# Patient Record
Sex: Female | Born: 1975 | Race: White | Hispanic: No | Marital: Married | Smoking: Never smoker
Health system: Southern US, Community
[De-identification: ages and names within clinical notes are randomized; demographics above are authoritative.]

## PROBLEM LIST (undated history)

## (undated) DIAGNOSIS — G039 Meningitis, unspecified: Secondary | ICD-10-CM

## (undated) DIAGNOSIS — G009 Bacterial meningitis, unspecified: Secondary | ICD-10-CM

## (undated) DIAGNOSIS — I429 Cardiomyopathy, unspecified: Secondary | ICD-10-CM

## (undated) DIAGNOSIS — D649 Anemia, unspecified: Secondary | ICD-10-CM

## (undated) DIAGNOSIS — I4892 Unspecified atrial flutter: Secondary | ICD-10-CM

## (undated) HISTORY — DX: Unspecified atrial flutter: I48.92

## (undated) HISTORY — DX: Cardiomyopathy, unspecified: I42.9

## (undated) HISTORY — DX: Anemia, unspecified: D64.9

## (undated) HISTORY — PX: OTHER SURGICAL HISTORY: SHX169

## (undated) HISTORY — PX: TUBAL LIGATION: SHX77

---

## 2013-01-29 ENCOUNTER — Emergency Department (HOSPITAL_COMMUNITY): Payer: BC Managed Care – PPO

## 2013-01-29 ENCOUNTER — Encounter (HOSPITAL_COMMUNITY): Payer: Self-pay | Admitting: *Deleted

## 2013-01-29 ENCOUNTER — Emergency Department (HOSPITAL_COMMUNITY)
Admission: EM | Admit: 2013-01-29 | Discharge: 2013-01-29 | Disposition: A | Discharge status: Home / Self Care | Payer: BC Managed Care – PPO | Attending: Emergency Medicine | Admitting: Emergency Medicine

## 2013-01-29 DIAGNOSIS — M79609 Pain in unspecified limb: Secondary | ICD-10-CM | POA: Insufficient documentation

## 2013-01-29 DIAGNOSIS — R0602 Shortness of breath: Secondary | ICD-10-CM | POA: Insufficient documentation

## 2013-01-29 DIAGNOSIS — Z79899 Other long term (current) drug therapy: Secondary | ICD-10-CM | POA: Insufficient documentation

## 2013-01-29 DIAGNOSIS — J3489 Other specified disorders of nose and nasal sinuses: Secondary | ICD-10-CM | POA: Insufficient documentation

## 2013-01-29 DIAGNOSIS — Z8661 Personal history of infections of the central nervous system: Secondary | ICD-10-CM | POA: Insufficient documentation

## 2013-01-29 DIAGNOSIS — M79604 Pain in right leg: Secondary | ICD-10-CM

## 2013-01-29 HISTORY — DX: Meningitis, unspecified: G03.9

## 2013-01-29 MED ORDER — NAPROXEN 500 MG PO TABS: 500.0000 mg | ORAL_TABLET | Freq: Two times a day (BID) | ORAL | Status: DC

## 2013-01-29 MED ORDER — HYDROCODONE-ACETAMINOPHEN 5-325 MG PO TABS
1.0000 | ORAL_TABLET | Freq: Once | ORAL | Status: DC
  Filled 2013-01-29: qty 1

## 2013-01-29 MED ORDER — HYDROCODONE-ACETAMINOPHEN 5-325 MG PO TABS: 1.0000 | ORAL_TABLET | Freq: Four times a day (QID) | ORAL | Status: DC | PRN

## 2013-01-29 NOTE — ED Notes (Signed)
C/o right leg pain for past 2 weeks, states knot behind right knee, states right foot was cooler than left last night

## 2013-01-29 NOTE — ED Notes (Signed)
Pt back from [place]

## 2013-01-29 NOTE — ED Provider Notes (Signed)
History  This chart was scribed for Shelda Jakes, MD by Ardelia Mems, ED Scribe. This patient was seen in room [facility]2/[facility] and the patient's care was started at 4:03 PM.   CSN: [id number]  Arrival date & time 01/29/13  1347     Chief Complaint  Patient presents with  . Leg Pain    HPI  HPI Comments: Denise Hood is a 37 y.o. female who presents to the Emergency Department complaining of 2 weeks of constant, moderate bilateral leg pain. Pt states that she became more concerned about the right leg yesterday when she had a sudden onset of severe pain behind the right knee. Pt reports associated swelling in her right thigh and states that she has a knot on the back of her right leg behind her knee. Pt also states that her right foot was cooler than her left last night. Pt reports some associated SOB and denies chest pain. Pt states that she was diaphoretic when she woke up this morning. Pt states that she has a h/o of having a blood clot in 2009. Pt states that she got a shot of Lovenox in her abdomen and the blood clot was gone the next day per [place]. Pt denies fever, chills, vomiting, diarrhea, headaches or any other symptoms. Pt denies smoking and alcohol use.     Past Medical History  Diagnosis Date  . Meningitis     Past Surgical History  Procedure Laterality Date  . Abscess surgery      History reviewed. No pertinent family history.  History  Substance Use Topics  . Smoking status: Never Smoker   . Smokeless tobacco: Not on file  . Alcohol Use: No    OB History   Grav Para Term Preterm Abortions TAB SAB Ect Mult Living                  Review of Systems  Constitutional: Negative for fever and chills.  HENT: Positive for congestion. Negative for sore throat, rhinorrhea and neck pain.   Eyes: Negative for visual disturbance.  Respiratory: Positive for shortness of breath. Negative for cough.   Cardiovascular: Negative for chest pain and leg swelling.   Gastrointestinal: Negative for nausea, vomiting, abdominal pain and diarrhea.  Genitourinary: Negative for dysuria and hematuria.  Musculoskeletal: Negative for back pain.  Skin: Negative for pallor.  Neurological: Negative for headaches.  A complete 10 system review of systems was obtained and all systems are negative except as noted in the HPI and PMH.    Allergies  Review of patient's allergies indicates no known allergies.  Home Medications   Current Outpatient Rx  Name  Route  Sig  Dispense  Refill  . amphetamine-dextroamphetamine (ADDERALL) 20 MG tablet   Oral   Take 20 mg by mouth 3 (three) times daily.         . Aspirin-Acetaminophen (GOODYS BODY PAIN PO)   Oral   Take 2 Packages by mouth daily as needed (blood clot).         . B Complex Vitamins (VITAMIN B COMPLEX PO)   Oral   Take 1 tablet by mouth daily.         Marland Kitchen lamoTRIgine (LAMICTAL) 200 MG tablet   Oral   Take 200 mg by mouth daily.         Marland Kitchen MACA ROOT PO   Oral   Take 1 each by mouth daily.         Marland Kitchen OVER THE COUNTER  MEDICATION   Oral   Take 2 tablets by mouth at bedtime.         . Polysacch Fe Cmp-Fe Heme Poly (BIFERA PO)   Oral   Take 2 tablets by mouth daily.         Marland Kitchen HYDROcodone-acetaminophen (NORCO/VICODIN) 5-325 MG per tablet   Oral   Take 1-2 tablets by mouth every 6 (six) hours as needed for pain.   14 tablet   0   . naproxen (NAPROSYN) 500 MG tablet   Oral   Take 1 tablet (500 mg total) by mouth 2 (two) times daily.   14 tablet   0     Triage Vitals: BP 123/74  Pulse 77  Temp(Src) 98.1 F (36.7 C) (Oral)  Resp 16  SpO2 100%  LMP 01/01/2013  Physical Exam  Constitutional: She is oriented to person, place, and time. She appears well-developed and well-nourished.  HENT:  Head: Normocephalic and atraumatic.  Eyes: EOM are normal. Pupils are equal, round, and reactive to light.  Neck: Normal range of motion. Neck supple. No tracheal deviation present.   Cardiovascular: Normal rate, regular rhythm, normal heart sounds and intact distal pulses.   No murmur heard. Pulmonary/Chest: Effort normal and breath sounds normal. No respiratory distress.  Abdominal: Soft. Bowel sounds are normal. There is no tenderness.  Musculoskeletal:  Tender in popliteal part of knee. No swelling in anterior knee, knee cap is placed properly. DP pulse to right foot is 2+. PT pulse to right foot is 2+.   Neurological: She is alert and oriented to person, place, and time.  Skin: Skin is warm and dry. No rash noted.  Psychiatric: She has a normal mood and affect.    ED Course  Procedures (including critical care time)  DIAGNOSTIC STUDIES: Oxygen Saturation is 100% on RA, normal by my interpretation.    COORDINATION OF CARE: 4:15 PM- Pt advised of plan for treatment and pt agrees.    Labs Reviewed - No data to display US Venous Img Lower Unilateral Right  01/29/2013   *RADIOLOGY REPORT*  Clinical Data: Right knee pain  RIGHT LOWER EXTREMITY VENOUS DUPLEX ULTRASOUND  Technique:  Gray-scale sonography with graded compression, as well as color Doppler and duplex ultrasound were performed to evaluate the deep venous system of the lower extremity from the level of the common femoral vein through the popliteal and proximal calf veins. Spectral Doppler was utilized to evaluate flow at rest and with distal augmentation maneuvers.  Comparison:  None.  Findings:  Normal compressibility of the common femoral, superficial femoral, and popliteal veins is demonstrated, as well as the visualized proximal calf veins.  No filling defects to suggest DVT on grayscale or color Doppler imaging.  Doppler waveforms show normal direction of venous flow, normal respiratory phasicity and response to augmentation. Great saphenous vein is patent and compressible.  IMPRESSION:  No evidence of lower extremity deep vein thrombosis.   Original Report Authenticated By: Malachy Moan, M.D.      1. Leg pain, right       MDM  Workup for the right leg pain mostly behind the knee without evidence of any acute knee injury or deformity or effusion. Also Doppler studies negative for deep vein thrombosis or Baker's cyst. Most likely musculoskeletal discomfort we'll treat with anti-inflammatories and pain medicine have patient followup with her primary care Dr. Patient was worried about a keeping thrombosis and she had one in the past.  I personally performed the services described in this documentation, which was scribed in my presence. The recorded information has been reviewed and is accurate.     Shelda Jakes, MD 01/29/13 [phone]

## 2017-01-18 ENCOUNTER — Other Ambulatory Visit (HOSPITAL_COMMUNITY): Payer: Self-pay | Admitting: Internal Medicine

## 2017-01-18 DIAGNOSIS — Z1231 Encounter for screening mammogram for malignant neoplasm of breast: Secondary | ICD-10-CM

## 2017-01-28 ENCOUNTER — Ambulatory Visit (HOSPITAL_COMMUNITY): Payer: 59

## 2019-07-21 ENCOUNTER — Other Ambulatory Visit: Payer: Self-pay

## 2019-07-21 DIAGNOSIS — Z20822 Contact with and (suspected) exposure to covid-19: Secondary | ICD-10-CM

## 2019-07-23 LAB — NOVEL CORONAVIRUS, NAA: SARS-CoV-2, NAA: NOT DETECTED

## 2019-11-03 ENCOUNTER — Emergency Department (HOSPITAL_COMMUNITY): Payer: Self-pay

## 2019-11-03 ENCOUNTER — Other Ambulatory Visit (HOSPITAL_COMMUNITY): Payer: Self-pay | Admitting: Internal Medicine

## 2019-11-03 ENCOUNTER — Other Ambulatory Visit: Payer: Self-pay

## 2019-11-03 ENCOUNTER — Encounter (HOSPITAL_COMMUNITY): Payer: Self-pay | Admitting: *Deleted

## 2019-11-03 ENCOUNTER — Ambulatory Visit (HOSPITAL_COMMUNITY)
Admission: RE | Admit: 2019-11-03 | Discharge: 2019-11-03 | Disposition: A | Discharge status: Home / Self Care | Payer: Self-pay | Source: Ambulatory Visit | Attending: Internal Medicine | Admitting: Internal Medicine

## 2019-11-03 ENCOUNTER — Observation Stay (HOSPITAL_COMMUNITY)
Admission: EM | Admit: 2019-11-03 | Discharge: 2019-11-05 | Disposition: A | Discharge status: Home / Self Care | Payer: Self-pay | Attending: Family Medicine | Admitting: Family Medicine

## 2019-11-03 DIAGNOSIS — Z7982 Long term (current) use of aspirin: Secondary | ICD-10-CM | POA: Insufficient documentation

## 2019-11-03 DIAGNOSIS — I272 Pulmonary hypertension, unspecified: Secondary | ICD-10-CM | POA: Insufficient documentation

## 2019-11-03 DIAGNOSIS — R002 Palpitations: Secondary | ICD-10-CM | POA: Insufficient documentation

## 2019-11-03 DIAGNOSIS — Z7901 Long term (current) use of anticoagulants: Secondary | ICD-10-CM | POA: Insufficient documentation

## 2019-11-03 DIAGNOSIS — I081 Rheumatic disorders of both mitral and tricuspid valves: Secondary | ICD-10-CM | POA: Insufficient documentation

## 2019-11-03 DIAGNOSIS — R0989 Other specified symptoms and signs involving the circulatory and respiratory systems: Secondary | ICD-10-CM | POA: Insufficient documentation

## 2019-11-03 DIAGNOSIS — Z20822 Contact with and (suspected) exposure to covid-19: Secondary | ICD-10-CM | POA: Insufficient documentation

## 2019-11-03 DIAGNOSIS — R05 Cough: Secondary | ICD-10-CM | POA: Insufficient documentation

## 2019-11-03 DIAGNOSIS — D500 Iron deficiency anemia secondary to blood loss (chronic): Secondary | ICD-10-CM

## 2019-11-03 DIAGNOSIS — G4700 Insomnia, unspecified: Secondary | ICD-10-CM | POA: Insufficient documentation

## 2019-11-03 DIAGNOSIS — Z8661 Personal history of infections of the central nervous system: Secondary | ICD-10-CM | POA: Insufficient documentation

## 2019-11-03 DIAGNOSIS — R0602 Shortness of breath: Secondary | ICD-10-CM | POA: Insufficient documentation

## 2019-11-03 DIAGNOSIS — R7989 Other specified abnormal findings of blood chemistry: Secondary | ICD-10-CM | POA: Insufficient documentation

## 2019-11-03 DIAGNOSIS — I5021 Acute systolic (congestive) heart failure: Secondary | ICD-10-CM | POA: Insufficient documentation

## 2019-11-03 DIAGNOSIS — D649 Anemia, unspecified: Secondary | ICD-10-CM | POA: Diagnosis present

## 2019-11-03 DIAGNOSIS — Z79899 Other long term (current) drug therapy: Secondary | ICD-10-CM | POA: Insufficient documentation

## 2019-11-03 DIAGNOSIS — R0609 Other forms of dyspnea: Secondary | ICD-10-CM | POA: Insufficient documentation

## 2019-11-03 DIAGNOSIS — I425 Other restrictive cardiomyopathy: Secondary | ICD-10-CM | POA: Insufficient documentation

## 2019-11-03 DIAGNOSIS — J90 Pleural effusion, not elsewhere classified: Secondary | ICD-10-CM | POA: Insufficient documentation

## 2019-11-03 DIAGNOSIS — Z885 Allergy status to narcotic agent status: Secondary | ICD-10-CM | POA: Insufficient documentation

## 2019-11-03 DIAGNOSIS — I38 Endocarditis, valve unspecified: Secondary | ICD-10-CM | POA: Diagnosis present

## 2019-11-03 DIAGNOSIS — R609 Edema, unspecified: Secondary | ICD-10-CM | POA: Insufficient documentation

## 2019-11-03 DIAGNOSIS — R000 Tachycardia, unspecified: Secondary | ICD-10-CM | POA: Insufficient documentation

## 2019-11-03 DIAGNOSIS — R0600 Dyspnea, unspecified: Secondary | ICD-10-CM | POA: Insufficient documentation

## 2019-11-03 DIAGNOSIS — R0789 Other chest pain: Secondary | ICD-10-CM | POA: Insufficient documentation

## 2019-11-03 DIAGNOSIS — I519 Heart disease, unspecified: Secondary | ICD-10-CM | POA: Diagnosis present

## 2019-11-03 DIAGNOSIS — D509 Iron deficiency anemia, unspecified: Secondary | ICD-10-CM | POA: Insufficient documentation

## 2019-11-03 DIAGNOSIS — N920 Excessive and frequent menstruation with regular cycle: Secondary | ICD-10-CM | POA: Insufficient documentation

## 2019-11-03 DIAGNOSIS — I4589 Other specified conduction disorders: Secondary | ICD-10-CM | POA: Insufficient documentation

## 2019-11-03 DIAGNOSIS — F419 Anxiety disorder, unspecified: Secondary | ICD-10-CM | POA: Insufficient documentation

## 2019-11-03 DIAGNOSIS — I4892 Unspecified atrial flutter: Secondary | ICD-10-CM | POA: Diagnosis present

## 2019-11-03 DIAGNOSIS — R0601 Orthopnea: Secondary | ICD-10-CM | POA: Insufficient documentation

## 2019-11-03 DIAGNOSIS — I441 Atrioventricular block, second degree: Secondary | ICD-10-CM | POA: Insufficient documentation

## 2019-11-03 DIAGNOSIS — Z791 Long term (current) use of non-steroidal anti-inflammatories (NSAID): Secondary | ICD-10-CM | POA: Insufficient documentation

## 2019-11-03 DIAGNOSIS — I483 Typical atrial flutter: Secondary | ICD-10-CM | POA: Insufficient documentation

## 2019-11-03 HISTORY — DX: Unspecified atrial flutter: I48.92

## 2019-11-03 HISTORY — DX: Bacterial meningitis, unspecified: G00.9

## 2019-11-03 LAB — HEPATIC FUNCTION PANEL
ALT: 91 U/L — ABNORMAL HIGH (ref 0–44)
AST: 72 U/L — ABNORMAL HIGH (ref 15–41)
Albumin: 3.7 g/dL (ref 3.5–5.0)
Alkaline Phosphatase: 109 U/L (ref 38–126)
Bilirubin, Direct: 0.1 mg/dL (ref 0.0–0.2)
Indirect Bilirubin: 0.5 mg/dL (ref 0.3–0.9)
Total Bilirubin: 0.6 mg/dL (ref 0.3–1.2)
Total Protein: 6.5 g/dL (ref 6.5–8.1)

## 2019-11-03 LAB — BASIC METABOLIC PANEL
Anion gap: 10 (ref 5–15)
BUN: 17 mg/dL (ref 6–20)
CO2: 24 mmol/L (ref 22–32)
Calcium: 8.9 mg/dL (ref 8.9–10.3)
Chloride: 102 mmol/L (ref 98–111)
Creatinine, Ser: 0.78 mg/dL (ref 0.44–1.00)
GFR calc Af Amer: >60 mL/min (ref >60)
GFR calc non Af Amer: >60 mL/min (ref >60)
Glucose, Bld: 119 mg/dL — ABNORMAL HIGH (ref 70–99)
Potassium: 3.6 mmol/L (ref 3.5–5.1)
Sodium: 136 mmol/L (ref 135–145)

## 2019-11-03 LAB — RESPIRATORY PANEL BY RT PCR (FLU A&B, COVID)
Influenza A by PCR: NEGATIVE
Influenza B by PCR: NEGATIVE
SARS Coronavirus 2 by RT PCR: NEGATIVE

## 2019-11-03 LAB — CBC WITH DIFFERENTIAL/PLATELET
Abs Immature Granulocytes: 0.09 10*3/uL — ABNORMAL HIGH (ref 0.00–0.07)
Basophils Absolute: 0.1 10*3/uL (ref 0.0–0.1)
Basophils Relative: 1 %
Eosinophils Absolute: 0.4 10*3/uL (ref 0.0–0.5)
Eosinophils Relative: 3 %
HCT: 34 % — ABNORMAL LOW (ref 36.0–46.0)
Hemoglobin: 10.2 g/dL — ABNORMAL LOW (ref 12.0–15.0)
Immature Granulocytes: 1 %
Lymphocytes Relative: 25 %
Lymphs Abs: 3.2 10*3/uL (ref 0.7–4.0)
MCH: 23.2 pg — ABNORMAL LOW (ref 26.0–34.0)
MCHC: 30 g/dL (ref 30.0–36.0)
MCV: 77.4 fL — ABNORMAL LOW (ref 80.0–100.0)
Monocytes Absolute: 1.1 10*3/uL — ABNORMAL HIGH (ref 0.1–1.0)
Monocytes Relative: 8 %
Neutro Abs: 8.3 10*3/uL — ABNORMAL HIGH (ref 1.7–7.7)
Neutrophils Relative %: 62 %
Platelets: 703 10*3/uL — ABNORMAL HIGH (ref 150–400)
RBC: 4.39 MIL/uL (ref 3.87–5.11)
RDW: 15.6 % — ABNORMAL HIGH (ref 11.5–15.5)
WBC: 13.2 10*3/uL — ABNORMAL HIGH (ref 4.0–10.5)
nRBC: 0.3 % — ABNORMAL HIGH (ref 0.0–0.2)

## 2019-11-03 LAB — MAGNESIUM: Magnesium: 1.8 mg/dL (ref 1.7–2.4)

## 2019-11-03 LAB — TROPONIN I (HIGH SENSITIVITY)
Troponin I (High Sensitivity): 33 ng/L — ABNORMAL HIGH (ref <18)
Troponin I (High Sensitivity): 34 ng/L — ABNORMAL HIGH (ref <18)

## 2019-11-03 LAB — APTT: aPTT: 27 seconds (ref 24–36)

## 2019-11-03 LAB — TSH: TSH: 3.334 u[IU]/mL (ref 0.350–4.500)

## 2019-11-03 LAB — PROTIME-INR
INR: 1.2 (ref 0.8–1.2)
Prothrombin Time: 14.7 seconds (ref 11.4–15.2)

## 2019-11-03 LAB — D-DIMER, QUANTITATIVE (NOT AT ARMC): D-Dimer, Quant: 0.57 ug/mL-FEU — ABNORMAL HIGH (ref 0.00–0.50)

## 2019-11-03 MED ORDER — ACETAMINOPHEN 650 MG RE SUPP: 650.0000 mg | Freq: Four times a day (QID) | RECTAL | Status: DC | PRN

## 2019-11-03 MED ORDER — POLYETHYLENE GLYCOL 3350 17 G PO PACK: 17.0000 g | PACK | Freq: Every day | ORAL | Status: DC | PRN

## 2019-11-03 MED ORDER — DILTIAZEM LOAD VIA INFUSION
10.0000 mg | Freq: Once | INTRAVENOUS | Status: AC
  Administered 2019-11-03: 10 mg via INTRAVENOUS
  Filled 2019-11-03: qty 10

## 2019-11-03 MED ORDER — ONDANSETRON HCL 4 MG PO TABS: 4.0000 mg | ORAL_TABLET | Freq: Four times a day (QID) | ORAL | Status: DC | PRN

## 2019-11-03 MED ORDER — SODIUM CHLORIDE 0.9% FLUSH
3.0000 mL | Freq: Two times a day (BID) | INTRAVENOUS | Status: DC
  Administered 2019-11-03 – 2019-11-05 (×4): 3 mL via INTRAVENOUS

## 2019-11-03 MED ORDER — PANTOPRAZOLE SODIUM 40 MG PO TBEC
40.0000 mg | DELAYED_RELEASE_TABLET | Freq: Every day | ORAL | Status: DC
  Administered 2019-11-03 – 2019-11-04 (×2): 40 mg via ORAL
  Filled 2019-11-03 (×2): qty 1

## 2019-11-03 MED ORDER — DILTIAZEM HCL-DEXTROSE 125-5 MG/125ML-% IV SOLN (PREMIX)
5.0000 mg/h | INTRAVENOUS | Status: DC
  Administered 2019-11-03: 5 mg/h via INTRAVENOUS
  Filled 2019-11-03: qty 125

## 2019-11-03 MED ORDER — HYDROCODONE-ACETAMINOPHEN 5-325 MG PO TABS: 1.0000 | ORAL_TABLET | ORAL | Status: DC | PRN

## 2019-11-03 MED ORDER — AMIODARONE LOAD VIA INFUSION
150.0000 mg | Freq: Once | INTRAVENOUS | Status: DC
  Filled 2019-11-03: qty 83.34

## 2019-11-03 MED ORDER — POTASSIUM CHLORIDE CRYS ER 20 MEQ PO TBCR
40.0000 meq | EXTENDED_RELEASE_TABLET | Freq: Once | ORAL | Status: AC
  Administered 2019-11-03: 40 meq via ORAL
  Filled 2019-11-03: qty 2

## 2019-11-03 MED ORDER — ACETAMINOPHEN 325 MG PO TABS: 650.0000 mg | ORAL_TABLET | Freq: Four times a day (QID) | ORAL | Status: DC | PRN

## 2019-11-03 MED ORDER — LAMOTRIGINE 25 MG PO TABS: 200.0000 mg | ORAL_TABLET | Freq: Every day | ORAL | Status: DC

## 2019-11-03 MED ORDER — ONDANSETRON HCL 4 MG/2ML IJ SOLN: 4.0000 mg | Freq: Four times a day (QID) | INTRAMUSCULAR | Status: DC | PRN

## 2019-11-03 MED ORDER — IOHEXOL 350 MG/ML SOLN
100.0000 mL | Freq: Once | INTRAVENOUS | Status: AC | PRN
  Administered 2019-11-03: 100 mL via INTRAVENOUS

## 2019-11-03 MED ORDER — HEPARIN (PORCINE) 25000 UT/250ML-% IV SOLN
1150.0000 [IU]/h | INTRAVENOUS | Status: DC
  Administered 2019-11-03: 950 [IU]/h via INTRAVENOUS
  Filled 2019-11-03: qty 250

## 2019-11-03 MED ORDER — AMIODARONE HCL IN DEXTROSE 360-4.14 MG/200ML-% IV SOLN: 30.0000 mg/h | INTRAVENOUS | Status: DC

## 2019-11-03 MED ORDER — LORAZEPAM 0.5 MG PO TABS
0.5000 mg | ORAL_TABLET | Freq: Three times a day (TID) | ORAL | Status: DC | PRN
  Administered 2019-11-03: 0.5 mg via ORAL
  Filled 2019-11-03: qty 1

## 2019-11-03 MED ORDER — AMIODARONE HCL IN DEXTROSE 360-4.14 MG/200ML-% IV SOLN
60.0000 mg/h | INTRAVENOUS | Status: DC
  Filled 2019-11-03: qty 200

## 2019-11-03 MED ORDER — HEPARIN BOLUS VIA INFUSION
4000.0000 [IU] | Freq: Once | INTRAVENOUS | Status: AC
  Administered 2019-11-03: 4000 [IU] via INTRAVENOUS

## 2019-11-03 MED ORDER — MAGNESIUM SULFATE 2 GM/50ML IV SOLN
2.0000 g | Freq: Once | INTRAVENOUS | Status: AC
  Administered 2019-11-03: 20:00:00 2 g via INTRAVENOUS
  Filled 2019-11-03: qty 50

## 2019-11-03 NOTE — ED Notes (Signed)
Patient transported to CT scan . 

## 2019-11-03 NOTE — H&P (Addendum)
History and Physical    Denise Hood AJG:[id number] DOB: 20-May-1976 DOA: 11/03/2019  PCP: [name], MD   Patient coming from: Home   Chief Complaint: SOB, chest discomfort   HPI: Denise Hood is a 44 y.o. female with medical history significant for history of bacterial meningitis, menorrhagia, and iron deficiency anemia, now presenting to the emergency department with shortness of breath and chest discomfort.  Patient reports that she had been experiencing some dyspnea with occasional cough for at least a month now, and then developed some discomfort in her chest for the past several days.  She reports starting prednisone for the respiratory illness a couple days ago, but stopped taking it due to anxiety and insomnia.  She has not had any fevers, hemoptysis, or lower extremity swelling or tenderness.  She has not taken her Adderall in a few days due to insomnia.  ED Course: Upon arrival to the ED, patient is found to be afebrile, saturating well on room air, heart rate 160, and blood pressure 150/100.  EKG features atrial flutter with predominantly 2:1 AV block and rate 158.  Chest x-ray notable for cardiomegaly with pulmonary vascular congestion and early interstitial edema.  CTA chest is negative for PE but notable for small right pleural effusion.  High-sensitivity troponin was elevated to 33 and essentially unchanged more than 2 hours later.  Chemistry panel with mild elevation in transaminases and CBC notable for leukocytosis to 13,200, thrombocytosis to 700,000, and macrocytic anemia with hemoglobin 10.2.  Covid and influenza PCR is negative.  Cardiology was consulted by the ED physician, recommended starting anticoagulation and diltiazem, and consulting the cardiologist at [facility] in the morning.  Patient was given 2 mg IV magnesium, 10 mg IV diltiazem, started on diltiazem infusion, and started on IV heparin.  Review of Systems:  All other systems reviewed and apart from  HPI, are negative.  Past Medical History:  Diagnosis Date  . Bacterial meningitis   . Meningitis     Past Surgical History:  Procedure Laterality Date  . abscess surgery    . TUBAL LIGATION       reports that she has never smoked. She has never used smokeless tobacco. She reports that she does not drink alcohol or use drugs.  Allergies  Allergen Reactions  . Dilaudid [Hydromorphone]   . Fentanyl     History reviewed. No pertinent family history.   Prior to Admission medications   Medication Sig Start Date End Date Taking? Authorizing Provider  amphetamine-dextroamphetamine (ADDERALL) 20 MG tablet Take 20 mg by mouth 3 (three) times daily.   Yes [provider]  benzonatate (TESSALON) 100 MG capsule Take 100 mg by mouth 3 (three) times daily as needed for cough.   Yes [provider]  dextromethorphan-guaiFENesin (MUCINEX DM) 30-600 MG 12hr tablet Take 1 tablet by mouth 2 (two) times daily.   Yes [provider]  naproxen (NAPROSYN) 500 MG tablet Take 1 tablet (500 mg total) by mouth 2 (two) times daily. 01/29/13  Yes Vanetta Mulders, MD  Aspirin-Acetaminophen (GOODYS BODY PAIN PO) Take 2 Packages by mouth daily as needed (blood clot).    [provider]  B Complex Vitamins (VITAMIN B COMPLEX PO) Take 1 tablet by mouth daily.    [provider]  lamoTRIgine (LAMICTAL) 200 MG tablet Take 200 mg by mouth daily.    [provider]  MACA ROOT PO Take 1 each by mouth daily.    [provider]  Polysacch Fe Cmp-Fe Heme Poly (BIFERA PO) Take 2 tablets by mouth daily.    [provider]    Physical Exam: Vitals:   11/03/19 2020 11/03/19 2030 11/03/19 2110 11/03/19 2120  BP: (!) 142/107 (!) 149/104 (!) 145/116   Pulse: (!) 162 (!) 162 (!) 160 (!) 161  Resp: (!) 29 (!) 26 20 (!) 29  Temp:      TempSrc:      SpO2: 98% 98% 95% 99%  Weight:      Height:         Constitutional: NAD, calm  Eyes: PERTLA, lids  and conjunctivae normal ENMT: Mucous membranes are moist. Posterior pharynx clear of any exudate or lesions.   Neck: normal, supple, no masses, no thyromegaly Respiratory: clear to auscultation bilaterally, no wheezing, no crackles. No accessory muscle use.  Cardiovascular: Rate ~145 and regular. No extremity edema.  Abdomen: No distension, no tenderness, soft. Bowel sounds active.  Musculoskeletal: no clubbing / cyanosis. No joint deformity upper and lower extremities.   Skin: no significant rashes, lesions, ulcers. Warm, dry, well-perfused. Neurologic: No facial asymmetry. Sensation intact. Moving all extremities.  Psychiatric: Alert and oriented, appropriate throughout interview and exam. Very pleasant and cooperative.    Labs and Imaging on Admission: I have personally reviewed following labs and imaging studies  CBC: Recent Labs  Lab 11/03/19 1742  WBC 13.2*  NEUTROABS 8.3*  HGB 10.2*  HCT 34.0*  MCV 77.4*  PLT 703*   Basic Metabolic Panel: Recent Labs  Lab 11/03/19 1742  NA 136  K 3.6  CL 102  CO2 24  GLUCOSE 119*  BUN 17  CREATININE 0.78  CALCIUM 8.9  MG 1.8   GFR: Estimated Creatinine Clearance: 86.4 mL/min (by C-G formula based on SCr of 0.78 mg/dL). Liver Function Tests: Recent Labs  Lab 11/03/19 1742  AST 72*  ALT 91*  ALKPHOS 109  BILITOT 0.6  PROT 6.5  ALBUMIN 3.7   No results for input(s): LIPASE, AMYLASE in the last 168 hours. No results for input(s): AMMONIA in the last 168 hours. Coagulation Profile: Recent Labs  Lab 11/03/19 1742  INR 1.2   Cardiac Enzymes: No results for input(s): CKTOTAL, CKMB, CKMBINDEX, TROPONINI in the last 168 hours. BNP (last 3 results) No results for input(s): PROBNP in the last 8760 hours. HbA1C: No results for input(s): HGBA1C in the last 72 hours. CBG: No results for input(s): GLUCAP in the last 168 hours. Lipid Profile: No results for input(s): CHOL, HDL, LDLCALC, TRIG, CHOLHDL, LDLDIRECT in the last  72 hours. Thyroid Function Tests: No results for input(s): TSH, T4TOTAL, FREET4, T3FREE, THYROIDAB in the last 72 hours. Anemia Panel: No results for input(s): VITAMINB12, FOLATE, FERRITIN, TIBC, IRON, RETICCTPCT in the last 72 hours. Urine analysis: No results found for: COLORURINE, APPEARANCEUR, LABSPEC, PHURINE, GLUCOSEU, HGBUR, BILIRUBINUR, KETONESUR, PROTEINUR, UROBILINOGEN, NITRITE, LEUKOCYTESUR Sepsis Labs: @LABRCNTIP (procalcitonin:4,lacticidven:4) ) Recent Results (from the past 240 hour(s))  Respiratory Panel by RT PCR (Flu A&B, Covid) - Nasopharyngeal Swab     Status: None   Collection Time: 11/03/19  8:51 PM   Specimen: Nasopharyngeal Swab  Result Value Ref Range Status   SARS Coronavirus 2 by RT PCR NEGATIVE NEGATIVE Final    Comment: (NOTE) SARS-CoV-2 target nucleic acids are NOT DETECTED. The SARS-CoV-2 RNA is generally detectable in upper respiratoy specimens during the acute phase of infection. The lowest concentration of SARS-CoV-2 viral copies this assay can detect is 131 copies/mL. A negative result does not preclude SARS-Cov-2 infection  and should not be used as the sole basis for treatment or other patient management decisions. A negative result may occur with  improper specimen collection/handling, submission of specimen other than nasopharyngeal swab, presence of viral mutation(s) within the areas targeted by this assay, and inadequate number of viral copies (<131 copies/mL). A negative result must be combined with clinical observations, patient history, and epidemiological information. The expected result is Negative. Fact Sheet for Patients:  PinkCheek.be Fact Sheet for Healthcare Providers:  GravelBags.it This test is not yet ap proved or cleared by the [place] FDA and  has been authorized for detection and/or diagnosis of SARS-CoV-2 by FDA under an Emergency Use Authorization (EUA). This EUA  will remain  in effect (meaning this test can be used) for the duration of the COVID-19 declaration under Section 564(b)(1) of the Act, 21 U.S.C. section 360bbb-3(b)(1), unless the authorization is terminated or revoked sooner.    Influenza A by PCR NEGATIVE NEGATIVE Final   Influenza B by PCR NEGATIVE NEGATIVE Final    Comment: (NOTE) The Xpert Xpress SARS-CoV-2/FLU/RSV assay is intended as an aid in  the diagnosis of influenza from Nasopharyngeal swab specimens and  should not be used as a sole basis for treatment. Nasal washings and  aspirates are unacceptable for Xpert Xpress SARS-CoV-2/FLU/RSV  testing. Fact Sheet for Patients: PinkCheek.be Fact Sheet for Healthcare Providers: GravelBags.it This test is not yet approved or cleared by the [place] FDA and  has been authorized for detection and/or diagnosis of SARS-CoV-2 by  FDA under an Emergency Use Authorization (EUA). This EUA will remain  in effect (meaning this test can be used) for the duration of the  Covid-19 declaration under Section 564(b)(1) of the Act, 21  U.S.C. section 360bbb-3(b)(1), unless the authorization is  terminated or revoked. Performed at [facility], [address], [place], [place] [address]      Radiological Exams on Admission: DG Chest 2 View  Result Date: 11/03/2019 CLINICAL DATA:  Shortness of breath EXAM: CHEST - 2 VIEW COMPARISON:  None. FINDINGS: The heart size is enlarged. There is some vascular congestion with early interstitial edema. There is no large focal infiltrate. No pneumothorax. There is no significant pleural effusion. IMPRESSION: Cardiomegaly with pulmonary vascular congestion and early interstitial edema. Electronically Signed   By: Constance Holster M.D.   On: 11/03/2019 18:11   CT Angio Chest PE W and/or Wo Contrast  Result Date: 11/03/2019 CLINICAL DATA:  Shortness of breath and tachycardia. EXAM: CT ANGIOGRAPHY  CHEST WITH CONTRAST TECHNIQUE: Multidetector CT imaging of the chest was performed using the standard protocol during bolus administration of intravenous contrast. Multiplanar CT image reconstructions and MIPs were obtained to evaluate the vascular anatomy. CONTRAST:  133mL OMNIPAQUE IOHEXOL 350 MG/ML SOLN COMPARISON:  None. FINDINGS: Cardiovascular: Satisfactory opacification of the pulmonary arteries to the segmental level. No evidence of pulmonary embolism. Normal heart size. No pericardial effusion. Mediastinum/Nodes: No enlarged mediastinal, hilar, or axillary lymph nodes. Thyroid gland, trachea, and esophagus demonstrate no significant findings. Lungs/Pleura: There is no evidence of acute infiltrate. A small right pleural effusion is seen. No pneumothorax is identified. Upper Abdomen: No acute abnormality. Musculoskeletal: No chest wall abnormality. No acute or significant osseous findings. Review of the MIP images confirms the above findings. IMPRESSION: 1. No CT evidence of acute pulmonary embolism. 2. Small right pleural effusion. Electronically Signed   By: Virgina Norfolk M.D.   On: 11/03/2019 20:17    EKG: Independently reviewed. Atrial flutter with 2:1 AV  block, rate 158.   Assessment/Plan   1. Atrial flutter with rapid ventricular response  - Presents with >1 month of SOB and several days of chest discomfort and is found to be in atrial flutter with 2:1 AV block and rate 160  - CTA chest negative for PE or infiltrate  - She was started on IV heparin and IV diltiazem in ED with no change in HR  - Discussed with cardiology again, assistance much appreciated, recommended stopping diltiazem since no change in HR and trying amiodarone but noted that she may remain in RVR until cardioverted  - Hold Adderrall, check TSH, continue IV heparin, start amiodarone, keep NPO after midnight    ADDENDUM: She converted to SR with rate in 90's prior to amiodarone. Will hold the amiodarone, continue IV  heparin and cardiac monitoring for now.    2. Iron-deficiency anemia  - Hgb is 10.2 on admission with MCV 77 and platelets 703k  - Patient reports long hx of IDA attributed to menorrhagia and managed with iron supplementation  - No active bleeding, monitor closely on IV heparin    DVT prophylaxis: IV heparin  Code Status: Full  Family Communication: Discussed with patient  Disposition Plan: Likely home in 24-48 hours after cardiology consultation and treatment of rapid atrial flutter  Consults called: Discussed with cardiology Admission status: Observation     Briscoe Deutscher, MD [facility] Pager: See [email or web address]  If 7AM-7PM, please contact the daytime attending [email or web address]  11/03/2019, 9:37 PM

## 2019-11-03 NOTE — ED Triage Notes (Signed)
C/o shortness of breath, states she was advised to come in for CT of CHEST

## 2019-11-03 NOTE — ED Provider Notes (Signed)
[facility] EMERGENCY DEPARTMENT Provider Note   CSN: [id number] Arrival date & time: 11/03/19  1703     History Chief Complaint  Patient presents with  . Shortness of Breath    Denise Hood is a 44 y.o. female.  Presented to emergency department with chief complaint shortness of breath.  Patient reports that she has been having intermittent shortness of breath over the past couple months, steadily worsening.  Over the past week, at least for the past 5 days has been also having some palpitations, feels like her heart is going fast.  Denies any chest pain at this time.  Only having shortness of breath with exertion.  Denies any past cardiac issues, denies prior history A. fib, a flutter.  Not on anticoagulation.  Only reported medical problem was remote history of bacterial meningitis, meningococcemia.  HPI     Past Medical History:  Diagnosis Date  . Bacterial meningitis   . Meningitis     There are no problems to display for this patient.   Past Surgical History:  Procedure Laterality Date  . abscess surgery    . TUBAL LIGATION       OB History   No obstetric history on file.     History reviewed. No pertinent family history.  Social History   Tobacco Use  . Smoking status: Never Smoker  . Smokeless tobacco: Never Used  Substance Use Topics  . Alcohol use: No  . Drug use: No    Home Medications Prior to Admission medications   Medication Sig Start Date End Date Taking? Authorizing Provider  amphetamine-dextroamphetamine (ADDERALL) 20 MG tablet Take 20 mg by mouth 3 (three) times daily.    [provider]  Aspirin-Acetaminophen (GOODYS BODY PAIN PO) Take 2 Packages by mouth daily as needed (blood clot).    [provider]  B Complex Vitamins (VITAMIN B COMPLEX PO) Take 1 tablet by mouth daily.    [provider]  HYDROcodone-acetaminophen (NORCO/VICODIN) 5-325 MG per tablet Take 1-2 tablets by mouth every 6 (six) hours as needed  for pain. 01/29/13   Vanetta Mulders, MD  lamoTRIgine (LAMICTAL) 200 MG tablet Take 200 mg by mouth daily.    [provider]  MACA ROOT PO Take 1 each by mouth daily.    [provider]  naproxen (NAPROSYN) 500 MG tablet Take 1 tablet (500 mg total) by mouth 2 (two) times daily. 01/29/13   Vanetta Mulders, MD  OVER THE COUNTER MEDICATION Take 2 tablets by mouth at bedtime.    [provider]  Polysacch Fe Cmp-Fe Heme Poly (BIFERA PO) Take 2 tablets by mouth daily.    [provider]    Allergies    Dilaudid [hydromorphone] and Fentanyl  Review of Systems   Review of Systems  Constitutional: Negative for chills and fever.  HENT: Negative for ear pain and sore throat.   Eyes: Negative for pain and visual disturbance.  Respiratory: Positive for shortness of breath. Negative for cough.   Cardiovascular: Positive for palpitations. Negative for chest pain.  Gastrointestinal: Negative for abdominal pain and vomiting.  Genitourinary: Negative for dysuria and hematuria.  Musculoskeletal: Negative for arthralgias and back pain.  Skin: Negative for color change and rash.  Neurological: Negative for seizures and syncope.  All other systems reviewed and are negative.   Physical Exam Updated Vital Signs BP (!) 152/93 (BP Location: Left Arm)   Pulse (!) 160 Comment: told rn high pulse  Temp (!) 97.3 F (36.3  C) (Oral)   Resp 20   Ht 5' (1.524 m)   Wt 85.7 kg   LMP 10/20/2019   SpO2 100%   BMI 36.91 kg/m   Physical Exam Vitals and nursing note reviewed.  Constitutional:      General: She is not in acute distress.    Appearance: She is well-developed.  HENT:     Head: Normocephalic and atraumatic.  Eyes:     Conjunctiva/sclera: Conjunctivae normal.  Cardiovascular:     Rate and Rhythm: Regular rhythm. Tachycardia present.     Heart sounds: No murmur.  Pulmonary:     Effort: Pulmonary effort is normal. No respiratory distress.     Breath  sounds: Normal breath sounds.  Abdominal:     Palpations: Abdomen is soft.     Tenderness: There is no abdominal tenderness.  Musculoskeletal:     Cervical back: Neck supple.  Skin:    General: Skin is warm and dry.  Neurological:     Mental Status: She is alert.     ED Results / Procedures / Treatments   Labs (all labs ordered are listed, but only abnormal results are displayed) Labs Reviewed - No data to display  EKG None  November 02 1732 atrial flutter, 2-1 AV block, rate 158, no STEMI, no prior for comparison   Radiology No results found.  Procedures .Critical Care Performed by: Milagros Loll, MD Authorized by: Milagros Loll, MD   Critical care provider statement:    Critical care time (minutes):  45   Critical care was necessary to treat or prevent imminent or life-threatening deterioration of the following conditions:  Cardiac failure   Critical care was time spent personally by me on the following activities:  Discussions with consultants, evaluation of patient's response to treatment, examination of patient, ordering and performing treatments and interventions, ordering and review of laboratory studies, ordering and review of radiographic studies, pulse oximetry, re-evaluation of patient's condition, obtaining history from patient or surrogate and review of old charts   (including critical care time)  Medications Ordered in ED Medications - No data to display  ED Course  I have reviewed the triage vital signs and the nursing notes.  Pertinent labs & imaging results that were available during my care of the patient were reviewed by me and considered in my medical decision making (see chart for details).  Clinical Course as of Nov 02 2332  Tue Nov 03, 2019  2050 D/w Shirlee Latch   [RD]    Clinical Course User Index [RD] Milagros Loll, MD   MDM Rules/Calculators/A&P                      44 year old lady presenting to ER with complaints of  palpitations, shortness of breath.  On exam patient was well-appearing, noted to have tachycardia in 150s, stable BP.  EKG demonstrating a flutter with 2-1 block.  Medical work-up noted for elevated dimer, labs otherwise grossly within normal limits, slightly low magnesium.  CTA chest negative for pulmonary embolism.  Borderline elevation of troponin, no chest pain, suspect rate related.  Doubt ACS.  Based on history, suspect she has been in a flutter for at least the last few days if not longer.  Given this, not ER candidate for electrical cardioversion.  Discussed with cardiology, started on diltiazem, anticoagulation with heparin.  Admit to hospitalist service.  Anticipate cardiology to see tomorrow to discuss cardioversion.   Dr. Antionette Char accepting.  Final Clinical  Impression(s) / ED Diagnoses Final diagnoses:  Typical atrial flutter [facility])    Rx / DC Orders ED Discharge Orders    None       Lucrezia Starch, MD 11/03/19 2340

## 2019-11-03 NOTE — Progress Notes (Signed)
ANTICOAGULATION CONSULT NOTE - Initial Consult  Pharmacy Consult for heparin Indication: atrial flutter  Allergies  Allergen Reactions  . Dilaudid [Hydromorphone]   . Fentanyl     Patient Measurements: Height: 5' (152.4 cm) Weight: 189 lb (85.7 kg) IBW/kg (Calculated) : 45.5 Heparin Dosing Weight: 65.5 kg  Vital Signs: Temp: 97.3 F (36.3 C) (03/16 1713) Temp Source: Oral (03/16 1713) BP: 152/93 (03/16 1713) Pulse Rate: 162 (03/16 1920)  Labs: Recent Labs    11/03/19 1742  HGB 10.2*  HCT 34.0*  PLT 703*  APTT 27  LABPROT 14.7  INR 1.2  CREATININE 0.78  TROPONINIHS 33*    Estimated Creatinine Clearance: 86.4 mL/min (by C-G formula based on SCr of 0.78 mg/dL).   Medical History: Past Medical History:  Diagnosis Date  . Bacterial meningitis   . Meningitis     Medications:  (Not in a hospital admission)   Assessment: 47 yof who [facility] with SOB. Pharmacy consulted to begin IV heparin for A flutter. No AC PTA noted. Hgb low at 10.2, platelets are elevated at 703.  Goal of Therapy:  Heparin level 0.3-0.7 units/ml Monitor platelets by anticoagulation protocol: Yes   Plan:  Heparin 4000 units IV bolus then 950 units/hr 8h HL Daily HL, CBC Monitor for s/sx of bleeding  Thank you for involving pharmacy in this patient's care.  Loura Back, PharmD, BCPS Clinical Pharmacist Clinical phone for 11/03/2019 until 9:30p is [phone] 11/03/2019 7:31 PM  **Pharmacist phone directory can be found on [email or web address] listed under [facility]**

## 2019-11-04 ENCOUNTER — Observation Stay (HOSPITAL_BASED_OUTPATIENT_CLINIC_OR_DEPARTMENT_OTHER): Payer: Self-pay

## 2019-11-04 ENCOUNTER — Encounter (HOSPITAL_COMMUNITY): Payer: Self-pay | Admitting: Family Medicine

## 2019-11-04 DIAGNOSIS — I5021 Acute systolic (congestive) heart failure: Secondary | ICD-10-CM | POA: Diagnosis present

## 2019-11-04 DIAGNOSIS — D649 Anemia, unspecified: Secondary | ICD-10-CM | POA: Diagnosis present

## 2019-11-04 DIAGNOSIS — I4892 Unspecified atrial flutter: Secondary | ICD-10-CM

## 2019-11-04 DIAGNOSIS — I519 Heart disease, unspecified: Secondary | ICD-10-CM | POA: Diagnosis present

## 2019-11-04 DIAGNOSIS — I361 Nonrheumatic tricuspid (valve) insufficiency: Secondary | ICD-10-CM

## 2019-11-04 DIAGNOSIS — D509 Iron deficiency anemia, unspecified: Secondary | ICD-10-CM

## 2019-11-04 DIAGNOSIS — I38 Endocarditis, valve unspecified: Secondary | ICD-10-CM | POA: Diagnosis present

## 2019-11-04 DIAGNOSIS — I340 Nonrheumatic mitral (valve) insufficiency: Secondary | ICD-10-CM

## 2019-11-04 DIAGNOSIS — N920 Excessive and frequent menstruation with regular cycle: Secondary | ICD-10-CM | POA: Diagnosis present

## 2019-11-04 DIAGNOSIS — I429 Cardiomyopathy, unspecified: Secondary | ICD-10-CM

## 2019-11-04 DIAGNOSIS — J90 Pleural effusion, not elsewhere classified: Secondary | ICD-10-CM | POA: Diagnosis present

## 2019-11-04 HISTORY — DX: Cardiomyopathy, unspecified: I42.9

## 2019-11-04 LAB — RETICULOCYTES
Immature Retic Fract: 25.3 % — ABNORMAL HIGH (ref 2.3–15.9)
RBC.: 3.89 MIL/uL (ref 3.87–5.11)
Retic Count, Absolute: 78.2 10*3/uL (ref 19.0–186.0)
Retic Ct Pct: 2 % (ref 0.4–3.1)

## 2019-11-04 LAB — COMPREHENSIVE METABOLIC PANEL
ALT: 79 U/L — ABNORMAL HIGH (ref 0–44)
AST: 60 U/L — ABNORMAL HIGH (ref 15–41)
Albumin: 3.3 g/dL — ABNORMAL LOW (ref 3.5–5.0)
Alkaline Phosphatase: 105 U/L (ref 38–126)
Anion gap: 10 (ref 5–15)
BUN: 17 mg/dL (ref 6–20)
CO2: 23 mmol/L (ref 22–32)
Calcium: 8.5 mg/dL — ABNORMAL LOW (ref 8.9–10.3)
Chloride: 103 mmol/L (ref 98–111)
Creatinine, Ser: 0.88 mg/dL (ref 0.44–1.00)
GFR calc Af Amer: >60 mL/min (ref >60)
GFR calc non Af Amer: >60 mL/min (ref >60)
Glucose, Bld: 124 mg/dL — ABNORMAL HIGH (ref 70–99)
Potassium: 3.7 mmol/L (ref 3.5–5.1)
Sodium: 136 mmol/L (ref 135–145)
Total Bilirubin: 0.6 mg/dL (ref 0.3–1.2)
Total Protein: 5.9 g/dL — ABNORMAL LOW (ref 6.5–8.1)

## 2019-11-04 LAB — CBC
HCT: 30.1 % — ABNORMAL LOW (ref 36.0–46.0)
Hemoglobin: 8.7 g/dL — ABNORMAL LOW (ref 12.0–15.0)
MCH: 22.4 pg — ABNORMAL LOW (ref 26.0–34.0)
MCHC: 28.9 g/dL — ABNORMAL LOW (ref 30.0–36.0)
MCV: 77.6 fL — ABNORMAL LOW (ref 80.0–100.0)
Platelets: 603 10*3/uL — ABNORMAL HIGH (ref 150–400)
RBC: 3.88 MIL/uL (ref 3.87–5.11)
RDW: 15.9 % — ABNORMAL HIGH (ref 11.5–15.5)
WBC: 12.2 10*3/uL — ABNORMAL HIGH (ref 4.0–10.5)
nRBC: 0.6 % — ABNORMAL HIGH (ref 0.0–0.2)

## 2019-11-04 LAB — HEPATITIS PANEL, ACUTE
HCV Ab: NONREACTIVE
Hep A IgM: NONREACTIVE
Hep B C IgM: NONREACTIVE
Hepatitis B Surface Ag: NONREACTIVE

## 2019-11-04 LAB — IRON AND TIBC
Iron: 19 ug/dL — ABNORMAL LOW (ref 28–170)
Saturation Ratios: 4 % — ABNORMAL LOW (ref 10.4–31.8)
TIBC: 432 ug/dL (ref 250–450)
UIBC: 413 ug/dL

## 2019-11-04 LAB — ECHOCARDIOGRAM COMPLETE
Height: 60 in
Weight: 3024 oz

## 2019-11-04 LAB — HEPARIN LEVEL (UNFRACTIONATED): Heparin Unfractionated: 0.19 IU/mL — ABNORMAL LOW (ref 0.30–0.70)

## 2019-11-04 LAB — HIV ANTIBODY (ROUTINE TESTING W REFLEX): HIV Screen 4th Generation wRfx: NONREACTIVE

## 2019-11-04 LAB — RAPID URINE DRUG SCREEN, HOSP PERFORMED
Amphetamines: NOT DETECTED
Barbiturates: NOT DETECTED
Benzodiazepines: POSITIVE — AB
Cocaine: NOT DETECTED
Opiates: NOT DETECTED
Tetrahydrocannabinol: NOT DETECTED

## 2019-11-04 LAB — FERRITIN: Ferritin: 8 ng/mL — ABNORMAL LOW (ref 11–307)

## 2019-11-04 LAB — URINALYSIS, ROUTINE W REFLEX MICROSCOPIC
Bilirubin Urine: NEGATIVE
Glucose, UA: NEGATIVE mg/dL
Hgb urine dipstick: NEGATIVE
Ketones, ur: NEGATIVE mg/dL
Leukocytes,Ua: NEGATIVE
Nitrite: NEGATIVE
Protein, ur: NEGATIVE mg/dL
Specific Gravity, Urine: 1.014 (ref 1.005–1.030)
pH: 6 (ref 5.0–8.0)

## 2019-11-04 LAB — FOLATE: Folate: 6.1 ng/mL (ref >5.9)

## 2019-11-04 LAB — VITAMIN B12: Vitamin B-12: 569 pg/mL (ref 180–914)

## 2019-11-04 LAB — MRSA PCR SCREENING: MRSA by PCR: NEGATIVE

## 2019-11-04 MED ORDER — ZOLPIDEM TARTRATE 5 MG PO TABS: 5.0000 mg | ORAL_TABLET | Freq: Every evening | ORAL | Status: DC | PRN

## 2019-11-04 MED ORDER — FUROSEMIDE 40 MG PO TABS
40.0000 mg | ORAL_TABLET | Freq: Every day | ORAL | Status: DC
  Administered 2019-11-05: 40 mg via ORAL
  Filled 2019-11-04: qty 1

## 2019-11-04 MED ORDER — FUROSEMIDE 10 MG/ML IJ SOLN
40.0000 mg | Freq: Once | INTRAMUSCULAR | Status: AC
  Administered 2019-11-04: 40 mg via INTRAVENOUS
  Filled 2019-11-04: qty 4

## 2019-11-04 MED ORDER — POTASSIUM CHLORIDE CRYS ER 20 MEQ PO TBCR
20.0000 meq | EXTENDED_RELEASE_TABLET | Freq: Every day | ORAL | Status: DC
  Administered 2019-11-05: 20 meq via ORAL
  Filled 2019-11-04: qty 1

## 2019-11-04 MED ORDER — CHLORHEXIDINE GLUCONATE CLOTH 2 % EX PADS
6.0000 | MEDICATED_PAD | Freq: Every day | CUTANEOUS | Status: DC
  Administered 2019-11-04: 6 via TOPICAL

## 2019-11-04 MED ORDER — HEPARIN BOLUS VIA INFUSION
2000.0000 [IU] | Freq: Once | INTRAVENOUS | Status: AC
  Administered 2019-11-04: 2000 [IU] via INTRAVENOUS
  Filled 2019-11-04: qty 2000

## 2019-11-04 MED ORDER — DILTIAZEM HCL ER COATED BEADS 120 MG PO CP24
120.0000 mg | ORAL_CAPSULE | Freq: Every day | ORAL | Status: DC
  Administered 2019-11-04: 120 mg via ORAL
  Filled 2019-11-04: qty 1

## 2019-11-04 MED ORDER — CARVEDILOL 3.125 MG PO TABS
3.1250 mg | ORAL_TABLET | Freq: Two times a day (BID) | ORAL | Status: DC
  Administered 2019-11-05: 3.125 mg via ORAL
  Filled 2019-11-04: qty 1

## 2019-11-04 NOTE — Progress Notes (Addendum)
Patient Demographics:    Denise Hood, is a 44 y.o. female, DOB - Jun 08, 1976, DEY:[id number]  Admit date - 11/03/2019   Admitting Physician Briscoe Deutscher, MD  Outpatient Primary MD for the patient is [name], MD  LOS - 0   Chief Complaint  Patient presents with  . 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 female with medical history significant for history of bacterial meningitis, menorrhagia, and iron deficiency anemia admitted on 11/03/2019 with atrial flutter with RVR, found to have EF of less than 20% on echo  A/p 1)HFrEF--echo on 11/04/2019 with EF less than 20%, global hypokinesis, moderate TR and moderate MR, grade 3 diastolic dysfunction with restrictive cardiomyopathy, as well as moderate pulmonary hypertension ??  Viral infection induced versus tachycardia induced cardiomyopathy --IV Lasix and Coreg as ordered -Cardiology input appreciated -Plans for repeat echo probably in about 3 months as outpatient after optimal therapy,  --patient may need left and right heart catheterization down the road -Hold off on ACE/ARB at this time due to soft BP- -remains quite symptomatic with dyspnea on exertion, very low EF of less  than 20% hold discharge on 10/20/2016 2021 CST-with initiation of Lasix and Coreg today   2)Atrial flutter--- converted back to sinus rhythm on IV Cardizem, no further Cardizem due to low EF -Coreg as above #1  CHA2DS2- VASc score   is = 1 (gender)    Which is  equal to = 0.6 % annual risk of stroke  --- Patient with menorrhagia with symptomatic anemia with hemoglobin below 9 and work-up revealing severe iron deficiency -Risk versus benefit of anticoagulation discussed with patient and cardiology service at this time no anticoagulation is planned- -May benefit from EP evaluation as outpatient and possible ablation  3)Symptomatic Anemia in the setting of menorrhagia----patient is status post prior tubal ligation, endorses menorrhagia with clots--- -hemoglobin below 9 and work-up revealing severe iron deficiency --- Patient will benefit from outpatient pelvic ultrasound, may be a candidate for Mirena IUD to help with menorrhagia -Stool occult blood requested -Anemia is probably contributing to patient's dyspnea  Disposition/Need for in-Hospital Stay- patient unable to be discharged at this time due to -remains quite symptomatic with dyspnea on exertion, very low EF of less than 20% hold discharge on 10/20/2016 2021 CST-with initiation of Lasix and Coreg today -Anticipate discharge home on 11/05/2019 if tolerates current therapy  Code Status : full  Family Communication:   (patient is alert, awake and coherent) --- Husband and mother at bedside  Consults  :  cardiology  DVT Prophylaxis  :   - SCDs   Lab Results  Component Value Date   PLT 603 (H) 11/04/2019    Inpatient Medications  Scheduled Meds: . carvedilol  3.125 mg Oral BID WC  . Chlorhexidine Gluconate Cloth  6 each Topical Daily  . [START ON 11/05/2019] furosemide  40 mg Oral Daily  . pantoprazole  40 mg Oral Q supper  . [START ON 11/05/2019] potassium chloride  20 mEq Oral Daily  . sodium chloride flush  3 mL Intravenous Q12H    Continuous Infusions: PRN Meds:.acetaminophen **OR** acetaminophen, HYDROcodone-acetaminophen, ondansetron **OR** ondansetron (ZOFRAN) IV, polyethylene glycol, zolpidem    Anti-infectives (From admission, onward)   None        Objective:   Vitals:   11/04/19 1101 11/04/19 1119 11/04/19 1130 11/04/19 1200  BP:   132/84 120/82  Pulse: 98 94 92 88  Resp:  19 18 16   Temp:  97.8 F (36.6 C)    TempSrc:  Oral    SpO2:  97% 99% 99%  Weight:      Height:        Wt Readings from Last 3 Encounters:  11/03/19 85.7 kg     Intake/Output Summary (Last 24 hours) at 11/04/2019 1512 Last data filed at 11/04/2019 1200 Gross per 24 hour  Intake 264.58 ml  Output 2200 ml  Net -1935.42 ml     Physical Exam  Gen:- Awake Alert,  dyspnea on exertion HEENT:- [place].AT, No sclera icterus Neck-Supple Neck,  Lungs-diminished in bases, no wheezing  CV- S1, S2 normal, regular (NO longer tachycardic) Abd-  +ve B.Sounds, Abd Soft, No tenderness,    Extremity/Skin:- No  edema, pedal pulses present  Psych-affect is appropriate, oriented x3 Neuro-no new focal deficits, no tremors   Data Review:   Micro Results Recent Results (from the past 240 hour(s))  Respiratory Panel by RT PCR (Flu A&B, Covid) - Nasopharyngeal Swab     Status: None   Collection Time: 11/03/19  8:51 PM   Specimen: Nasopharyngeal Swab  Result Value Ref Range Status   SARS Coronavirus 2 by RT PCR NEGATIVE NEGATIVE Final    Comment: (NOTE) SARS-CoV-2 target nucleic acids are NOT DETECTED. The SARS-CoV-2 RNA is generally detectable in upper respiratoy specimens during the acute phase of infection. The lowest concentration of SARS-CoV-2 viral copies this assay can detect is 131 copies/mL. A negative result does not preclude SARS-Cov-2 infection and should not be used as the sole basis for treatment or other patient management decisions. A negative result may occur with  improper specimen collection/handling, submission  of specimen other than nasopharyngeal swab, presence of viral mutation(s) within the areas targeted by this assay, and inadequate number of viral copies (<131 copies/mL). A negative result must be combined with clinical observations, patient history, and epidemiological information. The expected result is Negative. Fact Sheet for Patients:  PinkCheek.be Fact Sheet for Healthcare Providers:  GravelBags.it This test is not yet ap proved or cleared by the [place] FDA and  has been authorized for detection and/or diagnosis of SARS-CoV-2 by FDA under an Emergency Use Authorization (EUA). This EUA will remain  in effect (meaning this test can be used) for the duration of the COVID-19 declaration under Section 564(b)(1) of the Act, 21 U.S.C. section 360bbb-3(b)(1), unless the authorization is terminated or revoked sooner.    Influenza A by PCR NEGATIVE NEGATIVE Final   Influenza B by PCR NEGATIVE NEGATIVE Final    Comment: (NOTE) The Xpert  Xpress SARS-CoV-2/FLU/RSV assay is intended as an aid in  the diagnosis of influenza from Nasopharyngeal swab specimens and  should not be used as a sole basis for treatment. Nasal washings and  aspirates are unacceptable for Xpert Xpress SARS-CoV-2/FLU/RSV  testing. Fact Sheet for Patients: https://www.moore.com/ Fact Sheet for Healthcare Providers: https://www.young.biz/ This test is not yet approved or cleared by the [place] FDA and  has been authorized for detection and/or diagnosis of SARS-CoV-2 by  FDA under an Emergency Use Authorization (EUA). This EUA will remain  in effect (meaning this test can be used) for the duration of the  Covid-19 declaration under Section 564(b)(1) of the Act, 21  U.S.C. section 360bbb-3(b)(1), unless the authorization is  terminated or revoked. Performed at [facility], [address] [place], [place]  [address]   MRSA PCR Screening     Status: None   Collection Time: 11/04/19 12:11 AM   Specimen: Nasal Mucosa; Nasopharyngeal  Result Value Ref Range Status   MRSA by PCR NEGATIVE NEGATIVE Final    Comment:        The GeneXpert MRSA Assay (FDA approved for NASAL specimens only), is one component of a comprehensive MRSA colonization surveillance program. It is not intended to diagnose MRSA infection nor to guide or monitor treatment for MRSA infections. Performed at [facility], [address], [place], [place] [address]     Radiology Reports DG Chest 2 View  Result Date: 11/03/2019 CLINICAL DATA:  Shortness of breath EXAM: CHEST - 2 VIEW COMPARISON:  None. FINDINGS: The heart size is enlarged. There is some vascular congestion with early interstitial edema. There is no large focal infiltrate. No pneumothorax. There is no significant pleural effusion. IMPRESSION: Cardiomegaly with pulmonary vascular congestion and early interstitial edema. Electronically Signed   By: Katherine Mantle M.D.   On: 11/03/2019 18:11   CT Angio Chest PE W and/or Wo Contrast  Result Date: 11/03/2019 CLINICAL DATA:  Shortness of breath and tachycardia. EXAM: CT ANGIOGRAPHY CHEST WITH CONTRAST TECHNIQUE: Multidetector CT imaging of the chest was performed using the standard protocol during bolus administration of intravenous contrast. Multiplanar CT image reconstructions and MIPs were obtained to evaluate the vascular anatomy. CONTRAST:  OMNIPAQUE IOHEXOL 350 MG/ML SOLN COMPARISON:  None. FINDINGS: Cardiovascular: Satisfactory opacification of the pulmonary arteries to the segmental level. No evidence of pulmonary embolism. Normal heart size. No pericardial effusion. Mediastinum/Nodes: No enlarged mediastinal, hilar, or axillary lymph nodes. Thyroid gland, trachea, and esophagus demonstrate no significant findings. Lungs/Pleura: There is no evidence of acute infiltrate. A small right pleural effusion is seen.  No pneumothorax is identified. Upper Abdomen: No acute abnormality. Musculoskeletal: No chest wall abnormality. No acute or significant osseous findings. Review of the MIP images confirms the above findings. IMPRESSION: 1. No CT evidence of acute pulmonary embolism. 2. Small right pleural effusion. Electronically Signed   By: Aram Candela M.D.   On: 11/03/2019 20:17   ECHOCARDIOGRAM COMPLETE  Result Date: 11/04/2019    ECHOCARDIOGRAM REPORT   Patient Name:   Denise Hood Date of Exam: 11/04/2019 Medical Rec #:  [id number]      Height:       60.0 in Accession #:    [id number]     Weight:       189.0 lb Date of Birth:  06-11-1976      BSA:          1.822 m Patient Age:    43 years  BP:           140/99 mmHg Patient Gender: F              HR:           97 bpm. [facility] Procedure: 2D Echo Indications:    Abnormal ECG 794.31 / R94.31  History:        Patient has no prior history of Echocardiogram examinations.                 Arrythmias:Atrial Flutter. Bacterial meningitis, Iron deficiency                 anemia.  Sonographer:    Jeryl Columbia RDCS (AE) Referring Phys: [id number] Abhinav Mayorquin IMPRESSIONS  1. Left ventricular ejection fraction, by estimation, is <20%. The left ventricle has severely decreased function. The left ventricle demonstrates global hypokinesis. Left ventricular diastolic parameters are consistent with Grade III diastolic dysfunction (restrictive). Elevated left ventricular end-diastolic pressure.  2. Right ventricular systolic function is low normal. The right ventricular size is normal. There is moderately elevated pulmonary artery systolic pressure.  3. Right atrial size was mildly dilated.  4. The mitral valve is grossly normal. Moderate mitral valve regurgitation.  5. Tricuspid valve regurgitation is moderate.  6. The aortic valve is tricuspid. Aortic valve regurgitation is not visualized. No aortic stenosis is present.  7. The inferior vena cava is normal in  size with greater than 50% respiratory variability, suggesting right atrial pressure of 3 mmHg. FINDINGS  Left Ventricle: Left ventricular ejection fraction, by estimation, is <20%. The left ventricle has severely decreased function. The left ventricle demonstrates global hypokinesis. The left ventricular internal cavity size was normal in size. There is no  left ventricular hypertrophy. Left ventricular diastolic parameters are consistent with Grade III diastolic dysfunction (restrictive). Elevated left ventricular end-diastolic pressure. Right Ventricle: The right ventricular size is normal. No increase in right ventricular wall thickness. Right ventricular systolic function is low normal. There is moderately elevated pulmonary artery systolic pressure. The tricuspid regurgitant velocity  is 2.80 m/s, and with an assumed right atrial pressure of 10 mmHg, the estimated right ventricular systolic pressure is 41.4 mmHg. Left Atrium: Left atrial size was normal in size. Right Atrium: Right atrial size was mildly dilated. Pericardium: There is no evidence of pericardial effusion. Mitral Valve: The mitral valve is grossly normal. Moderate mitral valve regurgitation. Tricuspid Valve: The tricuspid valve is grossly normal. Tricuspid valve regurgitation is moderate. Aortic Valve: The aortic valve is tricuspid. Aortic valve regurgitation is not visualized. No aortic stenosis is present. Pulmonic Valve: The pulmonic valve was grossly normal. Pulmonic valve regurgitation is not visualized. Aorta: The aortic root is normal in size and structure. Venous: The inferior vena cava is normal in size with greater than 50% respiratory variability, suggesting right atrial pressure of 3 mmHg. IAS/Shunts: No atrial level shunt detected by color flow Doppler.  LEFT VENTRICLE PLAX 2D LVIDd:         5.02 cm  Diastology LVIDs:         4.44 cm  LV e' lateral:   7.56 cm/s LV PW:         1.18 cm  LV E/e' lateral: 15.5 LV IVS:        0.95 cm  LV  e' medial:    3.78 cm/s LVOT diam:     1.80 cm  LV E/e' medial:  31.0 LVOT Area:     2.54 cm  RIGHT VENTRICLE RV S prime:     13.50 cm/s TAPSE (M-mode): 2.4 cm LEFT ATRIUM             Index       RIGHT ATRIUM           Index LA diam:        4.10 cm 2.25 cm/m  RA Area:     18.70 cm LA Vol (A2C):   34.5 ml 18.93 ml/m RA Volume:   56.90 ml  31.22 ml/m LA Vol (A4C):   53.5 ml 29.36 ml/m LA Biplane Vol: 47.1 ml 25.85 ml/m   AORTA Ao Root diam: 2.60 cm MITRAL VALVE                 TRICUSPID VALVE MV Area (PHT): 4.49 cm      TR Peak grad:   31.4 mmHg MV Decel Time: 169 msec      TR Vmax:        280.00 cm/s MR Peak grad:    85.7 mmHg MR Mean grad:    58.0 mmHg   SHUNTS MR Vmax:         463.00 cm/s Systemic Diam: 1.80 cm MR Vmean:        358.0 cm/s MR PISA:         1.01 cm MR PISA Eff ROA: 7 mm MR PISA Radius:  0.40 cm MV E velocity: 117.00 cm/s MV A velocity: 26.70 cm/s MV E/A ratio:  4.38 Prentice Docker MD Electronically signed by Prentice Docker MD Signature Date/Time: 11/04/2019/10:50:04 AM    Final      CBC Recent Labs  Lab 11/03/19 1742 11/04/19 0352  WBC 13.2* 12.2*  HGB 10.2* 8.7*  HCT 34.0* 30.1*  PLT 703* 603*  MCV 77.4* 77.6*  MCH 23.2* 22.4*  MCHC 30.0 28.9*  RDW 15.6* 15.9*  LYMPHSABS 3.2  --   MONOABS 1.1*  --   EOSABS 0.4  --   BASOSABS 0.1  --     Chemistries  Recent Labs  Lab 11/03/19 1742 11/04/19 0352  NA 136 136  K 3.6 3.7  CL 102 103  CO2 24 23  GLUCOSE 119* 124*  BUN 17 17  CREATININE 0.78 0.88  CALCIUM 8.9 8.5*  MG 1.8  --   AST 72* 60*  ALT 91* 79*  ALKPHOS 109 105  BILITOT 0.6 0.6   ------------------------------------------------------------------------------------------------------------------ No results for input(s): CHOL, HDL, LDLCALC, TRIG, CHOLHDL, LDLDIRECT in the last 72 hours.  No results found for: HGBA1C ------------------------------------------------------------------------------------------------------------------ Recent Labs      11/03/19 1742  TSH 3.334   ------------------------------------------------------------------------------------------------------------------ Recent Labs    11/04/19 0352  VITAMINB12 569  FOLATE 6.1  FERRITIN 8*  TIBC 432  IRON 19*  RETICCTPCT 2.0    Coagulation profile Recent Labs  Lab 11/03/19 1742  INR 1.2    Recent Labs    11/03/19 1742  DDIMER 0.57*    Cardiac Enzymes No results for input(s): CKMB, TROPONINI, MYOGLOBIN in the last 168 hours.  Invalid input(s): CK ------------------------------------------------------------------------------------------------------------------ No results found for: BNP   Shon Hale M.D on 11/04/2019 at 3:12 PM  Go to [email or web address] - for contact info  [facility] - Office  [phone]

## 2019-11-04 NOTE — Progress Notes (Signed)
ANTICOAGULATION CONSULT NOTE  Pharmacy Consult for heparin Indication: atrial flutter  Allergies  Allergen Reactions  . Dilaudid [Hydromorphone]   . Fentanyl     Patient Measurements: Height: 5' (152.4 cm) Weight: 189 lb (85.7 kg) IBW/kg (Calculated) : 45.5 Heparin Dosing Weight: 65.5 kg  Vital Signs: BP: 144/98 (03/17 0020) Pulse Rate: 103 (03/17 0020)  Labs: Recent Labs    11/03/19 1742 11/03/19 2025 11/04/19 0352  HGB 10.2*  --  8.7*  HCT 34.0*  --  30.1*  PLT 703*  --  603*  APTT 27  --   --   LABPROT 14.7  --   --   INR 1.2  --   --   HEPARINUNFRC  --   --  0.19*  CREATININE 0.78  --  0.88  TROPONINIHS 33* 34*  --     Estimated Creatinine Clearance: 78.5 mL/min (by C-G formula based on SCr of 0.88 mg/dL).  Assessment: 44 y.o. female with Aflutter for heparin  Goal of Therapy:  Heparin level 0.3-0.7 units/ml Monitor platelets by anticoagulation protocol: Yes   Plan:  .Heparin 2000 units IV bolus, then increase heparin  1150 units/hr Check heparin level in 8 hours.   Geannie Risen, PharmD, BCPS

## 2019-11-04 NOTE — Progress Notes (Signed)
*  PRELIMINARY RESULTS* Echocardiogram 2D Echocardiogram has been performed.  Jeryl Columbia 11/04/2019, 9:29 AM

## 2019-11-04 NOTE — Consult Note (Addendum)
Cardiology Consult    Patient ID: Denise Hood; [id number]; 05-Aug-1976   Admit date: 11/03/2019 Date of Consult: 11/04/2019  Primary Care Provider: [name], MD Primary Cardiologist: New to [facility] - Dr. Purvis Sheffield  Patient Profile    Denise Hood is a 44 y.o. female with past medical history of remote history of bacterial meningitis and no prior cardiac history who is being seen today for the evaluation of new-onset atrial flutter with RVR at the request of Dr. Antionette Char.   History of Present Illness    Ms. Hennings presented to [facility] ED on 11/03/2019 for evaluation of worsening dyspnea for the past several months. Also reported having palpitations for the past 4-5 days prior to admission. In talking with the patient today, she reports worsening dyspnea at rest and with activity for the past 3-4 months. Has been evaluated by her PCP multiple times and been prescribed antibiotics and steroids intermittently. Several days ago, she developed associated palpitations. Since then, she has not been able to sleep at night due to frequent coughing, orthopnea and feeling anxious.   She denies any prior cardiac history of arrhythmias, CAD or CHF. No known family history of cardiac issues. She consumes 2 sodas daily. No alcohol use.   Initial labs showed WBC 13.2, Hgb 10.2, platelets 703, Na+ 136, K+ 3.6 and creatinine 0.78. Initial HS Troponin 33 with repeat of 34. Mg 1.8. TSH 3.334. COVID negative. CXR showed cardiomegaly with pulmonary vascular congestion. CTA showed no evidence of acute PE but was noted to have a small right pleural effusion. EKG showed atrial flutter with RVR, 2:1 block, HR 158 with no prior tracings available for comparison.   She was started on IV Cardizem while in the ED and converted back to NSR. Was also started on Heparin for anticoagulation. Orders were entered for IV Amiodarone but this was never given as she converted to NSR with IV Cardizem.   Past Medical History:    Diagnosis Date  . Bacterial meningitis   . Meningitis     Past Surgical History:  Procedure Laterality Date  . abscess surgery    . TUBAL LIGATION       Home Medications:  Prior to Admission medications   Medication Sig Start Date End Date Taking? Authorizing Provider  amphetamine-dextroamphetamine (ADDERALL) 20 MG tablet Take 20 mg by mouth 3 (three) times daily.   Yes [provider]  benzonatate (TESSALON) 100 MG capsule Take 100 mg by mouth 3 (three) times daily as needed for cough.   Yes [provider]  dextromethorphan-guaiFENesin (MUCINEX DM) 30-600 MG 12hr tablet Take 1 tablet by mouth 2 (two) times daily.   Yes [provider]  naproxen (NAPROSYN) 500 MG tablet Take 1 tablet (500 mg total) by mouth 2 (two) times daily. 01/29/13  Yes Vanetta Mulders, MD    Inpatient Medications: Scheduled Meds: . Chlorhexidine Gluconate Cloth  6 each Topical Daily  . diltiazem  120 mg Oral Daily  . furosemide  40 mg Intravenous Once  . pantoprazole  40 mg Oral Q supper  . sodium chloride flush  3 mL Intravenous Q12H   Continuous Infusions:  PRN Meds: acetaminophen **OR** acetaminophen, HYDROcodone-acetaminophen, ondansetron **OR** ondansetron (ZOFRAN) IV, polyethylene glycol, zolpidem  Allergies:    Allergies  Allergen Reactions  . Dilaudid [Hydromorphone]   . Fentanyl     Social History:   Social History   Socioeconomic History  . Marital status: Married    Spouse name: Not on  file  . Number of children: Not on file  . Years of education: Not on file  . Highest education level: Not on file  Occupational History  . Not on file  Tobacco Use  . Smoking status: Never Smoker  . Smokeless tobacco: Never Used  Substance and Sexual Activity  . Alcohol use: No  . Drug use: No  . Sexual activity: Not on file  Other Topics Concern  . Not on file  Social History Narrative  . Not on file   Social Determinants of Health   Financial Resource  Strain:   . Difficulty of Paying Living Expenses:   Food Insecurity:   . Worried About Programme researcher, broadcasting/film/video in the Last Year:   . Barista in the Last Year:   Transportation Needs:   . Freight forwarder (Medical):   Marland Kitchen Lack of Transportation (Non-Medical):   Physical Activity:   . Days of Exercise per Week:   . Minutes of Exercise per Session:   Stress:   . Feeling of Stress :   Social Connections:   . Frequency of Communication with Friends and Family:   . Frequency of Social Gatherings with Friends and Family:   . Attends Religious Services:   . Active Member of Clubs or Organizations:   . Attends Banker Meetings:   Marland Kitchen Marital Status:   Intimate Partner Violence:   . Fear of Current or Ex-Partner:   . Emotionally Abused:   Marland Kitchen Physically Abused:   . Sexually Abused:      Family History:   History reviewed. Patient denies any family history of cardiac issues.    Review of Systems    General:  No chills, fever, night sweats or weight changes.  Cardiovascular:  No chest pain, dyspnea on exertion, edema, paroxysmal nocturnal dyspnea. Positive for palpitations and orthopnea.  Dermatological: No rash, lesions/masses Respiratory: No cough, dyspnea Urologic: No hematuria, dysuria Abdominal:   No nausea, vomiting, diarrhea, bright red blood per rectum, melena, or hematemesis Neurologic:  No visual changes, wkns, changes in mental status. All other systems reviewed and are otherwise negative except as noted above.  Denise Corporal, RN was present throughout the entirety of the encounter.  Physical Exam/Data    Vitals:   11/04/19 0200 11/04/19 0741 11/04/19 0800 11/04/19 0830  BP: (!) 128/98  (!) 139/97 (!) 140/99  Pulse: (!) 101 94 98 97  Resp: (!) 48  17   Temp:  97.8 F (36.6 C)    TempSrc:  Oral    SpO2: 99% 98% 100% 96%  Weight:      Height:        Intake/Output Summary (Last 24 hours) at 11/04/2019 0904 Last data filed at 11/04/2019  0741 Gross per 24 hour  Intake 66.45 ml  Output 200 ml  Net -133.55 ml   Filed Weights   11/03/19 1713  Weight: 85.7 kg   Body mass index is 36.91 kg/m.   General: Pleasant female appearing in NAD. Appears anxious.  Psych: Normal affect. Neuro: Alert and oriented X 3. Moves all extremities spontaneously. HEENT: Normal  Neck: Supple without bruits or JVD. Lungs:  Resp regular and unlabored, mildly decreased along bases. Heart: RRR no s3, s4, or murmurs. Abdomen: Soft, non-tender, non-distended, BS + x 4.  Extremities: No clubbing, cyanosis or lower extremity edema. DP/PT/Radials 2+ and equal bilaterally.   EKG:  The EKG was personally reviewed and demonstrates: Atrial flutter with RVR, 2:1 block, HR  158 with no prior tracings available for comparison.   Telemetry:  Telemetry was personally reviewed and demonstrates: Atrial flutter initially with HR in the 150's. Converted to NSR on 3/16 at 2150 and has been in NSR since with HR in 90's to low-100's.    Labs/Studies     Relevant CV Studies:  Echocardiogram: Pending  Laboratory Data:  Chemistry Recent Labs  Lab 11/03/19 1742 11/04/19 0352  NA 136 136  K 3.6 3.7  CL 102 103  CO2 24 23  GLUCOSE 119* 124*  BUN 17 17  CREATININE 0.78 0.88  CALCIUM 8.9 8.5*  GFRNONAA >60 >60  GFRAA >60 >60  ANIONGAP 10 10    Recent Labs  Lab 11/03/19 1742 11/04/19 0352  PROT 6.5 5.9*  ALBUMIN 3.7 3.3*  AST 72* 60*  ALT 91* 79*  ALKPHOS 109 105  BILITOT 0.6 0.6   Hematology Recent Labs  Lab 11/03/19 1742 11/04/19 0352  WBC 13.2* 12.2*  RBC 4.39 3.88  3.89  HGB 10.2* 8.7*  HCT 34.0* 30.1*  MCV 77.4* 77.6*  MCH 23.2* 22.4*  MCHC 30.0 28.9*  RDW 15.6* 15.9*  PLT 703* 603*   Cardiac EnzymesNo results for input(s): TROPONINI in the last 168 hours. No results for input(s): TROPIPOC in the last 168 hours.  BNPNo results for input(s): BNP, PROBNP in the last 168 hours.  DDimer  Recent Labs  Lab 11/03/19 1742   DDIMER 0.57*    Radiology/Studies:  DG Chest 2 View  Result Date: 11/03/2019 CLINICAL DATA:  Shortness of breath EXAM: CHEST - 2 VIEW COMPARISON:  None. FINDINGS: The heart size is enlarged. There is some vascular congestion with early interstitial edema. There is no large focal infiltrate. No pneumothorax. There is no significant pleural effusion. IMPRESSION: Cardiomegaly with pulmonary vascular congestion and early interstitial edema. Electronically Signed   By: Katherine Mantle M.D.   On: 11/03/2019 18:11   CT Angio Chest PE W and/or Wo Contrast  Result Date: 11/03/2019 CLINICAL DATA:  Shortness of breath and tachycardia. EXAM: CT ANGIOGRAPHY CHEST WITH CONTRAST TECHNIQUE: Multidetector CT imaging of the chest was performed using the standard protocol during bolus administration of intravenous contrast. Multiplanar CT image reconstructions and MIPs were obtained to evaluate the vascular anatomy. CONTRAST:  OMNIPAQUE IOHEXOL 350 MG/ML SOLN COMPARISON:  None. FINDINGS: Cardiovascular: Satisfactory opacification of the pulmonary arteries to the segmental level. No evidence of pulmonary embolism. Normal heart size. No pericardial effusion. Mediastinum/Nodes: No enlarged mediastinal, hilar, or axillary lymph nodes. Thyroid gland, trachea, and esophagus demonstrate no significant findings. Lungs/Pleura: There is no evidence of acute infiltrate. A small right pleural effusion is seen. No pneumothorax is identified. Upper Abdomen: No acute abnormality. Musculoskeletal: No chest wall abnormality. No acute or significant osseous findings. Review of the MIP images confirms the above findings. IMPRESSION: 1. No CT evidence of acute pulmonary embolism. 2. Small right pleural effusion. Electronically Signed   By: Aram Candela M.D.   On: 11/03/2019 20:17     Assessment & Plan    1. Atrial Flutter with RVR - presents with a 4-month history of intermittent dyspnea which had been treated by her PCP  with antibiotics, steroids, and OTC cold medications. Developed palpitations 5 days ago and reports orthopnea and worsening dyspnea since.  - found to be in new-onset atrial flutter with RVR upon admission. Labs showed WBC 13.2, Hgb 10.2, platelets 703, Na+ 136, K+ 3.6 and creatinine 0.78. Mg 1.8. TSH 3.334. CTA without evidence of acute  PE  - she was started on IV Cardizem while in the ED and converted to NSR. Has maintained NSR since. Will plan to start Cardizem CD 120mg  daily unless EF reduced.  - This patients CHA2DS2-VASc Score and unadjusted Ischemic Stroke Rate (% per year) is equal to 0.6 % stroke rate/year from a score of 1 (female). Currently on Heparin and given her low-risk score, no indication for long-term anticoagulation at this time. Will review with Dr. Bronson Ing in regards to being on Eliquis for 30 days or not initiating at this time given her anemia.   2. Pleural Effusion - she does report orthopnea and CTA did show a small effusion. Echo is pending to assess LV function and wall motion. Will order one time dose of IV Lasix 40mg .  3. Anemia/Thrombocytosis - Hgb 10.2 on admission and platelets 703. Repeat CBC this AM showed Hgb at 8.7 and platelets 603. Will stop Heparin as outlined above. Ferritin low at 8 so she would benefit from iron supplementation. Further management per admitting team.    For questions or updates, please contact [place] Please consult [email or web address] for contact info under Cardiology/STEMI.  Signed, Erma Heritage, PA-C 11/04/2019, 9:04 AM Pager: [phone]  The patient was seen and examined, and I agree with the history, physical exam, assessment and plan as documented above, with modifications made above and as noted below. I have also personally reviewed all relevant documentation, old records, labs, and both radiographic and cardiovascular studies. I have also independently interpreted old and new ECG's.  This is a 44 year old female  with no prior cardiac history whom cardiology is asked to evaluate for rapid atrial flutter.  She has been experiencing palpitations for the last 4 to 5 days.  They have been intermittent in nature.  She also has a history of anxiety and panic attacks and has episodic palpitations associated with this she believes.   She also has a history of chronic anemia and heavy menses.  She has been experiencing worsening shortness of breath at rest for the last 3 to 4 months.  She has been seen by her PCP who has prescribed both antibiotics and steroids at various times.  She has no family history of cardiac disease.  Labs reviewed above with normal renal function and TSH.  Hemoglobin initially 10.2 which dropped to 8.7 with IV heparin.  She was found to be in rapid atrial flutter with 2:1 conduction (ECG personally reviewed).  She was started on IV diltiazem and converted back to sinus rhythm.  I personally reviewed the echocardiogram which demonstrated severely reduced LV systolic function with LVEF 15-20% with grade 3 diastolic dysfunction (restrictive), mild right atrial dilatation, and both moderate mitral and tricuspid regurgitation.  She had radiographic evidence of CHF both with chest x-ray and CT.   Recommendations: I spoke at length with the patient, her mother, her husband, and the hospitalist.  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  Will avoid the use of calcium channel blockers. I will start carvedilol 3.125 mg twice daily as she is also mildly hypertensive.  I will then evaluate her in the near future in the outpatient setting and consider the addition of either an ACE inhibitor or angiotensin receptor blocker if blood pressure permits.  I will also start Lasix 40 mg daily with supplemental potassium beginning 11/05/2019 as we will provide IV Lasix 40 mg once  today.  Given that  her hemoglobin dropped to 8.7 from 10.2 with the use of IV heparin, I will avoid the use of systemic anticoagulation.  I spoke to her about seeing EP in the outpatient setting for atrial flutter ablation consideration as well.  After she has been on guideline directed medical therapy for minimum of 3 months, I will repeat a limited echocardiogram to reassess LVEF.   Prentice Docker, MD, [facility]  11/04/2019 11:37 AM

## 2019-11-05 LAB — CBC
HCT: 29.5 % — ABNORMAL LOW (ref 36.0–46.0)
Hemoglobin: 8.4 g/dL — ABNORMAL LOW (ref 12.0–15.0)
MCH: 22.8 pg — ABNORMAL LOW (ref 26.0–34.0)
MCHC: 28.5 g/dL — ABNORMAL LOW (ref 30.0–36.0)
MCV: 79.9 fL — ABNORMAL LOW (ref 80.0–100.0)
Platelets: 492 10*3/uL — ABNORMAL HIGH (ref 150–400)
RBC: 3.69 MIL/uL — ABNORMAL LOW (ref 3.87–5.11)
RDW: 15.9 % — ABNORMAL HIGH (ref 11.5–15.5)
WBC: 9.5 10*3/uL (ref 4.0–10.5)
nRBC: 0 % (ref 0.0–0.2)

## 2019-11-05 MED ORDER — FERROUS SULFATE 325 (65 FE) MG PO TABS
325.0000 mg | ORAL_TABLET | Freq: Two times a day (BID) | ORAL | Status: DC
  Administered 2019-11-05: 325 mg via ORAL
  Filled 2019-11-05: qty 1

## 2019-11-05 MED ORDER — PANTOPRAZOLE SODIUM 40 MG PO TBEC: 40.0000 mg | DELAYED_RELEASE_TABLET | Freq: Every day | ORAL | 2 refills | Status: DC

## 2019-11-05 MED ORDER — FUROSEMIDE 40 MG PO TABS: 40.0000 mg | ORAL_TABLET | Freq: Every day | ORAL | 2 refills | Status: DC

## 2019-11-05 MED ORDER — ACETAMINOPHEN 325 MG PO TABS: 650.0000 mg | ORAL_TABLET | Freq: Four times a day (QID) | ORAL | 0 refills | Status: AC | PRN

## 2019-11-05 MED ORDER — CARVEDILOL 3.125 MG PO TABS: 3.1250 mg | ORAL_TABLET | Freq: Two times a day (BID) | ORAL | 6 refills | Status: DC

## 2019-11-05 MED ORDER — FERROUS SULFATE 325 (65 FE) MG PO TABS: 325.0000 mg | ORAL_TABLET | Freq: Two times a day (BID) | ORAL | 3 refills | Status: AC

## 2019-11-05 MED ORDER — POTASSIUM CHLORIDE CRYS ER 20 MEQ PO TBCR: 20.0000 meq | EXTENDED_RELEASE_TABLET | Freq: Every day | ORAL | 1 refills | Status: DC

## 2019-11-05 NOTE — Discharge Instructions (Signed)
1)Avoid ibuprofen/Advil/Aleve/Motrin/Goody Powders/Naproxen/BC powders/Meloxicam/Diclofenac/Indomethacin and other Nonsteroidal anti-inflammatory medications as these will make you more likely to bleed and can cause stomach ulcers, can also cause Kidney problems.   2)Follow- up with Primary care physician to get a pelvic ultrasound to discuss possible use of Mirena IUD to control your heavy periods  3) follow-up with Dr. Purvis Sheffield the cardiologist as advised, you will need repeat echocardiogram over the next 3 months or so... You most likely need to see an EP cardiologist for possible ablation due to atrial flutter  4) you need repeat CBC/complete blood count to assess your anemia in a couple of weeks  5) you also need BMP/basic metabolic panel to assess your electrolytes and kidney function due to furosemide/diuretic and potassium use   ----Will need Repeat CBC and BMP blood test in a couple of weeks -- will need a pelvic ultrasound and possible referral to GYN for Mirena IUD placement to control your heavy menstrual flow

## 2019-11-05 NOTE — Progress Notes (Addendum)
Progress Note  Patient Name: Denise Hood Date of Encounter: 11/05/2019  Primary Cardiologist: No primary care provider on file.   Subjective   She is feeling much better today. She denies chest pain, palpitations, shortness of breath. She had a bit of coughing earlier this morning. She denies leg swelling. Her mother who is with her had questions about her daughter drinking Coke.  Inpatient Medications    Scheduled Meds: . carvedilol  3.125 mg Oral BID WC  . Chlorhexidine Gluconate Cloth  6 each Topical Daily  . ferrous sulfate  325 mg Oral BID WC  . furosemide  40 mg Oral Daily  . pantoprazole  40 mg Oral Q supper  . potassium chloride  20 mEq Oral Daily  . sodium chloride flush  3 mL Intravenous Q12H   Continuous Infusions:  PRN Meds: acetaminophen **OR** acetaminophen, HYDROcodone-acetaminophen, ondansetron **OR** ondansetron (ZOFRAN) IV, polyethylene glycol, zolpidem   Denise Corporal, RN was present throughout the entirety of the encounter.  Vital Signs    Vitals:   11/04/19 2000 11/04/19 2351 11/05/19 0557 11/05/19 0914  BP: 114/75 (!) 133/95 118/78 (!) 137/91  Pulse: 81 85 88 89  Resp: 20 16 16    Temp: 98.2 F (36.8 C) 99.3 F (37.4 C) 98.6 F (37 C)   TempSrc: Oral Oral Oral   SpO2: 97% 100% 100%   Weight:      Height:        Intake/Output Summary (Last 24 hours) at 11/05/2019 1018 Last data filed at 11/04/2019 1500 Gross per 24 hour  Intake 192.13 ml  Output 2450 ml  Net -2257.87 ml   Filed Weights   11/03/19 1713  Weight: 85.7 kg    Telemetry    NSR - Personally Reviewed   Physical Exam   GEN: No acute distress.   Neck: No JVD Cardiac: RRR, no murmurs, rubs, or gallops.  Respiratory: Clear to auscultation bilaterally. GI: Soft, nontender, non-distended  MS: No edema; No deformity. Neuro:  Nonfocal  Psych: Normal affect   Labs    Chemistry Recent Labs  Lab 11/03/19 1742 11/04/19 0352  NA 136 136  K 3.6 3.7  CL 102 103    CO2 24 23  GLUCOSE 119* 124*  BUN 17 17  CREATININE 0.78 0.88  CALCIUM 8.9 8.5*  PROT 6.5 5.9*  ALBUMIN 3.7 3.3*  AST 72* 60*  ALT 91* 79*  ALKPHOS 109 105  BILITOT 0.6 0.6  GFRNONAA >60 >60  GFRAA >60 >60  ANIONGAP 10 10     Hematology Recent Labs  Lab 11/03/19 1742 11/04/19 0352 11/05/19 0547  WBC 13.2* 12.2* 9.5  RBC 4.39 3.88  3.89 3.69*  HGB 10.2* 8.7* 8.4*  HCT 34.0* 30.1* 29.5*  MCV 77.4* 77.6* 79.9*  MCH 23.2* 22.4* 22.8*  MCHC 30.0 28.9* 28.5*  RDW 15.6* 15.9* 15.9*  PLT 703* 603* 492*    Cardiac EnzymesNo results for input(s): TROPONINI in the last 168 hours. No results for input(s): TROPIPOC in the last 168 hours.   BNPNo results for input(s): BNP, PROBNP in the last 168 hours.   DDimer  Recent Labs  Lab 11/03/19 1742  DDIMER 0.57*     Radiology    DG Chest 2 View  Result Date: 11/03/2019 CLINICAL DATA:  Shortness of breath EXAM: CHEST - 2 VIEW COMPARISON:  None. FINDINGS: The heart size is enlarged. There is some vascular congestion with early interstitial edema. There is no large focal infiltrate. No pneumothorax. There is no  significant pleural effusion. IMPRESSION: Cardiomegaly with pulmonary vascular congestion and early interstitial edema. Electronically Signed   By: Constance Holster M.D.   On: 11/03/2019 18:11   CT Angio Chest PE W and/or Wo Contrast  Result Date: 11/03/2019 CLINICAL DATA:  Shortness of breath and tachycardia. EXAM: CT ANGIOGRAPHY CHEST WITH CONTRAST TECHNIQUE: Multidetector CT imaging of the chest was performed using the standard protocol during bolus administration of intravenous contrast. Multiplanar CT image reconstructions and MIPs were obtained to evaluate the vascular anatomy. CONTRAST:  12mL OMNIPAQUE IOHEXOL 350 MG/ML SOLN COMPARISON:  None. FINDINGS: Cardiovascular: Satisfactory opacification of the pulmonary arteries to the segmental level. No evidence of pulmonary embolism. Normal heart size. No pericardial  effusion. Mediastinum/Nodes: No enlarged mediastinal, hilar, or axillary lymph nodes. Thyroid gland, trachea, and esophagus demonstrate no significant findings. Lungs/Pleura: There is no evidence of acute infiltrate. A small right pleural effusion is seen. No pneumothorax is identified. Upper Abdomen: No acute abnormality. Musculoskeletal: No chest wall abnormality. No acute or significant osseous findings. Review of the MIP images confirms the above findings. IMPRESSION: 1. No CT evidence of acute pulmonary embolism. 2. Small right pleural effusion. Electronically Signed   By: Virgina Norfolk M.D.   On: 11/03/2019 20:17   ECHOCARDIOGRAM COMPLETE  Result Date: 11/04/2019    ECHOCARDIOGRAM REPORT   Patient Name:   Denise Hood Date of Exam: 11/04/2019 Medical Rec #:  [id number]      Height:       60.0 in Accession #:    [id number]     Weight:       189.0 lb Date of Birth:  1975/11/14      BSA:          1.822 m Patient Age:    44 years       BP:           140/99 mmHg Patient Gender: F              HR:           97 bpm. Exam Location:  [facility] Procedure: 2D Echo Indications:    Abnormal ECG 794.31 / R94.31  History:        Patient has no prior history of Echocardiogram examinations.                 Arrythmias:Atrial Flutter. Bacterial meningitis, Iron deficiency                 anemia.  Sonographer:    Leavy Cella RDCS (AE) Referring Phys: [id number] COURAGE EMOKPAE IMPRESSIONS  1. Left ventricular ejection fraction, by estimation, is <20%. The left ventricle has severely decreased function. The left ventricle demonstrates global hypokinesis. Left ventricular diastolic parameters are consistent with Grade III diastolic dysfunction (restrictive). Elevated left ventricular end-diastolic pressure.  2. Right ventricular systolic function is low normal. The right ventricular size is normal. There is moderately elevated pulmonary artery systolic pressure.  3. Right atrial size was mildly dilated.  4. The mitral  valve is grossly normal. Moderate mitral valve regurgitation.  5. Tricuspid valve regurgitation is moderate.  6. The aortic valve is tricuspid. Aortic valve regurgitation is not visualized. No aortic stenosis is present.  7. The inferior vena cava is normal in size with greater than 50% respiratory variability, suggesting right atrial pressure of 3 mmHg. FINDINGS  Left Ventricle: Left ventricular ejection fraction, by estimation, is <20%. The left ventricle has severely decreased function. The left ventricle demonstrates global hypokinesis. The left ventricular  internal cavity size was normal in size. There is no  left ventricular hypertrophy. Left ventricular diastolic parameters are consistent with Grade III diastolic dysfunction (restrictive). Elevated left ventricular end-diastolic pressure. Right Ventricle: The right ventricular size is normal. No increase in right ventricular wall thickness. Right ventricular systolic function is low normal. There is moderately elevated pulmonary artery systolic pressure. The tricuspid regurgitant velocity  is 2.80 m/s, and with an assumed right atrial pressure of 10 mmHg, the estimated right ventricular systolic pressure is 41.4 mmHg. Left Atrium: Left atrial size was normal in size. Right Atrium: Right atrial size was mildly dilated. Pericardium: There is no evidence of pericardial effusion. Mitral Valve: The mitral valve is grossly normal. Moderate mitral valve regurgitation. Tricuspid Valve: The tricuspid valve is grossly normal. Tricuspid valve regurgitation is moderate. Aortic Valve: The aortic valve is tricuspid. Aortic valve regurgitation is not visualized. No aortic stenosis is present. Pulmonic Valve: The pulmonic valve was grossly normal. Pulmonic valve regurgitation is not visualized. Aorta: The aortic root is normal in size and structure. Venous: The inferior vena cava is normal in size with greater than 50% respiratory variability, suggesting right atrial  pressure of 3 mmHg. IAS/Shunts: No atrial level shunt detected by color flow Doppler.  LEFT VENTRICLE PLAX 2D LVIDd:         5.02 cm  Diastology LVIDs:         4.44 cm  LV e' lateral:   7.56 cm/s LV PW:         1.18 cm  LV E/e' lateral: 15.5 LV IVS:        0.95 cm  LV e' medial:    3.78 cm/s LVOT diam:     1.80 cm  LV E/e' medial:  31.0 LVOT Area:     2.54 cm  RIGHT VENTRICLE RV S prime:     13.50 cm/s TAPSE (M-mode): 2.4 cm LEFT ATRIUM             Index       RIGHT ATRIUM           Index LA diam:        4.10 cm 2.25 cm/m  RA Area:     18.70 cm LA Vol (A2C):   34.5 ml 18.93 ml/m RA Volume:   56.90 ml  31.22 ml/m LA Vol (A4C):   53.5 ml 29.36 ml/m LA Biplane Vol: 47.1 ml 25.85 ml/m   AORTA Ao Root diam: 2.60 cm MITRAL VALVE                 TRICUSPID VALVE MV Area (PHT): 4.49 cm      TR Peak grad:   31.4 mmHg MV Decel Time: 169 msec      TR Vmax:        280.00 cm/s MR Peak grad:    85.7 mmHg MR Mean grad:    58.0 mmHg   SHUNTS MR Vmax:         463.00 cm/s Systemic Diam: 1.80 cm MR Vmean:        358.0 cm/s MR PISA:         1.01 cm MR PISA Eff ROA: 7 mm MR PISA Radius:  0.40 cm MV E velocity: 117.00 cm/s MV A velocity: 26.70 cm/s MV E/A ratio:  4.38 Prentice Docker MD Electronically signed by Prentice Docker MD Signature Date/Time: 11/04/2019/10:50:04 AM    Final     Cardiac Studies    Echocardiogram 11/04/2019:  1. Left ventricular ejection fraction,  by estimation, is <20%. The left  ventricle has severely decreased function. The left ventricle demonstrates  global hypokinesis. Left ventricular diastolic parameters are consistent  with Grade III diastolic  dysfunction (restrictive). Elevated left ventricular end-diastolic  pressure.  2. Right ventricular systolic function is low normal. The right  ventricular size is normal. There is moderately elevated pulmonary artery  systolic pressure.  3. Right atrial size was mildly dilated.  4. The mitral valve is grossly normal. Moderate  mitral valve  regurgitation.  5. Tricuspid valve regurgitation is moderate.  6. The aortic valve is tricuspid. Aortic valve regurgitation is not  visualized. No aortic stenosis is present.  7. The inferior vena cava is normal in size with greater than 50%  respiratory variability, suggesting right atrial pressure of 3 mmHg.   Patient Profile     44 y.o. female with past medical history of remote history of bacterial meningitis and no prior cardiac history who is being seen today for the evaluation of new-onset atrial flutter with RVR at the request of Dr. Antionette Char.   Assessment & Plan    1. Acute systolic heart failure/cardiomyopathy: LVEF 15 to 20% by echocardiogram on 11/04/2019 with restrictive diastolic dysfunction and moderate mitral and tricuspid regurgitation. She was given a dose of IV Lasix yesterday and has been started on oral Lasix 40 mg daily with supplemental potassium today. She is net approximately 2.5 L in the past 24 hours. Symptomatically stable. She will be started on carvedilol today. I will evaluate her in the near future in the outpatient setting and consider the addition of either an ACE inhibitor or angiotensin receptor blocker if blood pressure permits. 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 After she has been on guideline directed medical therapy for minimum of 3 months, I will repeat a limited echocardiogram to reassess LVEF.  2.  Atrial flutter with RVR: She was found to be in new onset atrial flutter with RVR upon admission.  She received IV Cardizem in the ED and converted to sinus rhythm.  She will be started on carvedilol today. She has a history of chronic anemia with hemoglobin presently in the 8 range and I will thus avoid the use of systemic anticoagulation. I spoke to her about seeing EP in the outpatient setting for atrial flutter ablation  consideration.  3.  Anemia: This is chronic.  Hemoglobin 8.4 today.  I will avoid the use of systemic anticoagulation for atrial flutter.  Currently on ferrous sulfate.   [facility] will sign off.   Medication Recommendations:  As above Other recommendations (labs, testing, etc):  As above Follow up as an outpatient:  Will arrange  For questions or updates, please contact [facility] Please consult [email or web address] for contact info under Cardiology/STEMI.      Signed, Prentice Docker, MD  11/05/2019, 10:18 AM

## 2019-11-05 NOTE — Discharge Summary (Signed)
Denise Hood, is a 44 y.o. female  DOB 25-Mar-1976  MRN [id number].  Admission date:  11/03/2019  Admitting Physician  Briscoe Deutscher, MD  Discharge Date:  11/05/2019   Primary MD  [name], MD  Recommendations for primary care physician for things to follow:   1)Avoid ibuprofen/Advil/Aleve/Motrin/Goody Powders/Naproxen/BC powders/Meloxicam/Diclofenac/Indomethacin and other Nonsteroidal anti-inflammatory medications as these will make you more likely to bleed and can cause stomach ulcers, can also cause Kidney problems.   2)Follow- up with Primary care physician to get a pelvic ultrasound to discuss possible use of Mirena IUD to control your heavy periods  3)Follow-up with Dr. Purvis Sheffield the cardiologist as advised, you will need repeat echocardiogram over the next 3 months or so... You most likely need to see an EP cardiologist for possible ablation due to atrial flutter  4)You need repeat CBC/complete blood count to assess your anemia in a couple of weeks  5)You also need BMP/basic metabolic panel to assess your electrolytes and kidney function due to furosemide/diuretic and potassium use  ----Will need Repeat CBC and BMP blood test in a couple of weeks -- will need a pelvic ultrasound and possible referral to GYN for Mirena IUD placement to control your heavy menstrual flow  Admission Diagnosis  Atrial flutter with rapid ventricular response (HCC) [I48.92] Typical atrial flutter (HCC) [I48.3]   Discharge Diagnosis  Atrial flutter with rapid ventricular response (HCC) [I48.92] Typical atrial flutter (HCC) [I48.3]   Principal Problem:   Acute HFrEF (heart failure with reduced ejection fraction) -EF less than 20% 11/04/2019 Active Problems:   Atrial flutter with rapid ventricular response (HCC)   Symptomatic anemia--iron deficiency anemia due to menorrhagia   Iron deficiency anemia   Severe left  ventricular systolic dysfunction   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 Bacterial meningitis   . Meningitis     Past Surgical History:  Procedure Laterality Date  . abscess surgery    . TUBAL LIGATION       HPI  from the history and physical done on the day of admission:    Patient coming from: Home   Chief Complaint: SOB, chest discomfort   HPI: Denise Hood is a 44 y.o. female with medical history significant for history of bacterial meningitis, menorrhagia, and iron deficiency anemia, now presenting to the emergency department with shortness of breath and chest discomfort.  Patient reports that she had been experiencing some dyspnea with occasional cough for at least a month now, and then developed some discomfort in her chest for the past several days.  She reports starting prednisone for the respiratory illness a couple days ago, but stopped taking it due to anxiety and insomnia.  She has not had any fevers, hemoptysis, or lower extremity swelling or tenderness.  She has not taken her Adderall in a few days due to insomnia.  ED Course: Upon arrival to the ED, patient is found to be afebrile, saturating well on room air, heart rate 160, and blood pressure 150/100.  EKG features atrial flutter with predominantly 2:1 AV block and rate 158.  Chest x-ray notable for cardiomegaly with pulmonary vascular congestion and early interstitial edema.  CTA chest is negative for PE but notable for small right pleural effusion.  High-sensitivity troponin was elevated to 33 and essentially unchanged more than 2 hours later.  Chemistry panel with mild elevation in transaminases and CBC notable for leukocytosis to 13,200, thrombocytosis to 700,000, and macrocytic anemia with hemoglobin 10.2.  Covid and influenza PCR is negative.  Cardiology was  consulted by the ED physician, recommended starting anticoagulation and diltiazem, and consulting the cardiologist at [facility] in the morning.  Patient was given 2 mg IV magnesium, 10 mg IV diltiazem, started on diltiazem infusion, and started on IV heparin.  Review of Systems:  All other systems reviewed and apart from HPI, are negative.   Hospital Course:    Brief Summary:- 44 y.o.femalewith medical history significant forhistory of bacterial meningitis, menorrhagia, and iron deficiency anemia admitted on 11/03/2019 with atrial flutter with RVR, found to have EF of less than 20% on echo  A/p 1)HFrEF--echo on 11/04/2019 with EF less than 20%, global hypokinesis, moderate TR and moderate MR, grade 3 diastolic dysfunction with restrictive cardiomyopathy, as well as moderate pulmonary hypertension ??  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 Lasix and Coreg   2)Atrial flutter--- converted back to sinus rhythm on IV Cardizem, no further Cardizem due to low EF -Coreg as above #1 CHA2DS2- VASc score   is = 1 (gender)    Which is  equal to = 0.6 % annual risk of stroke  --- Patient with menorrhagia with symptomatic anemia with hemoglobin below 9 and work-up revealing severe iron deficiency -Risk versus benefit of anticoagulation discussed with patient and cardiology service at this time no anticoagulation is planned- -May benefit from EP evaluation as outpatient and possible ablation -Avoid Cardizem due to low EF, okay to use Coreg for rate control and for systolic dysfunction CHF  3)Symptomatic Anemia in the setting of menorrhagia----patient is status  post prior tubal ligation, endorses menorrhagia with clots--- -hemoglobin below 9 and work-up revealing severe iron deficiency --- Patient will benefit from outpatient pelvic ultrasound, may be a candidate for Mirena IUD to help with menorrhagia -Anemia is probably contributing to patient's dyspnea  Disposition--homeCode Status : full  Family Communication:   (patient is alert, awake and coherent) --- Husband and mother at bedside  Consults  :  cardiology  Discharge Condition: Stable  Follow UP  Follow-up Information    Leone Brand, NP Follow up on 11/23/2019.   Specialties: Cardiology, Radiology Why: Cardiology Hospital Follow-up on 11/23/2019 at 3:00 PM.  Contact information: [address] [place] [address] [phone]        [name], MD. Schedule an appointment as soon as possible for a visit in 2 week(s).   Specialty: Internal Medicine Why: --Will need Repeat CBC and BMP blood test in a couple of weeks -- will need a pelvic ultrasound and possible referral to GYN  for Mirena IUD placement to control your heavy menstrual flow Contact information: [address] [place] [address] [phone]           Diet and Activity recommendation:  As advised  Discharge Instructions    Discharge Instructions    Call MD for:  difficulty breathing, headache or visual disturbances   Complete by: As directed    Call MD for:  persistant dizziness or light-headedness   Complete by: As directed    Call MD for:  persistant nausea and vomiting   Complete by: As directed    Call MD for:  severe uncontrolled pain   Complete by: As directed    Call MD for:  temperature >100.4   Complete by: As directed    Diet - low sodium heart healthy   Complete by: As directed    Discharge instructions   Complete by: As directed    1)Avoid ibuprofen/Advil/Aleve/Motrin/Goody Powders/Naproxen/BC powders/Meloxicam/Diclofenac/Indomethacin and other Nonsteroidal  anti-inflammatory medications as these will make you more likely to bleed and can cause stomach ulcers, can also cause Kidney problems.   2)Follow- up with Primary care physician to get a pelvic ultrasound to discuss possible use of Mirena IUD to control your heavy periods  3) follow-up with Dr. Purvis Sheffield the cardiologist as advised, you will need repeat echocardiogram over the next 3 months or so... You most likely need to see an EP cardiologist for possible ablation due to atrial flutter  4) you need repeat CBC/complete blood count to assess your anemia in a couple of weeks  5) you also need BMP/basic metabolic panel to assess your electrolytes and kidney function due to furosemide/diuretic and potassium use  -- 1)Avoid ibuprofen/Advil/Aleve/Motrin/Goody Powders/Naproxen/BC powders/Meloxicam/Diclofenac/Indomethacin and other Nonsteroidal anti-inflammatory medications as these will make you more likely to bleed and can cause stomach ulcers, can also cause Kidney problems.   2)Follow- up with Primary care physician to get a pelvic ultrasound to discuss possible use of Mirena IUD to control your heavy periods  3) follow-up with Dr. Purvis Sheffield the cardiologist as advised, you will need repeat echocardiogram over the next 3 months or so... You most likely need to see an EP cardiologist for possible ablation due to atrial flutter  4) you need repeat CBC/complete blood count to assess your anemia in a couple of weeks  5) you also need BMP/basic metabolic panel to assess your electrolytes and kidney function due to furosemide/diuretic and potassium use   ----Will need Repeat CBC and BMP blood test in a couple of weeks -- will need a pelvic ultrasound and possible referral to GYN for Mirena IUD placement to control your heavy menstrual flow   Increase activity slowly   Complete by: As directed         Discharge Medications     Allergies as of 11/05/2019      Reactions   Dilaudid  [hydromorphone]    Fentanyl       Medication List    STOP taking these medications   naproxen 500 MG tablet Commonly known as: NAPROSYN     TAKE these medications   acetaminophen 325 MG tablet Commonly known as: TYLENOL Take 2 tablets (650 mg total) by mouth every 6 (six) hours as needed for mild pain, fever or headache (or Fever >/= 101).   amphetamine-dextroamphetamine 20 MG tablet Commonly known as: ADDERALL Take 20 mg by mouth 3 (three) times daily.   benzonatate 100 MG capsule Commonly known as: TESSALON Take 100 mg by mouth  3 (three) times daily as needed for cough.   carvedilol 3.125 MG tablet Commonly known as: COREG Take 1 tablet (3.125 mg total) by mouth 2 (two) times daily with a meal.   dextromethorphan-guaiFENesin 30-600 MG 12hr tablet Commonly known as: MUCINEX DM Take 1 tablet by mouth 2 (two) times daily.   ferrous sulfate 325 (65 FE) MG tablet Take 1 tablet (325 mg total) by mouth 2 (two) times daily with a meal.   furosemide 40 MG tablet Commonly known as: LASIX Take 1 tablet (40 mg total) by mouth daily. Start taking on: November 06, 2019   pantoprazole 40 MG tablet Commonly known as: PROTONIX Take 1 tablet (40 mg total) by mouth daily before breakfast.   potassium chloride SA 20 MEQ tablet Commonly known as: KLOR-CON Take 1 tablet (20 mEq total) by mouth daily. Start taking on: November 06, 2019       Major procedures and Radiology Reports - PLEASE review detailed and final reports for all details, in brief -   DG Chest 2 View  Result Date: 11/03/2019 CLINICAL DATA:  Shortness of breath EXAM: CHEST - 2 VIEW COMPARISON:  None. FINDINGS: The heart size is enlarged. There is some vascular congestion with early interstitial edema. There is no large focal infiltrate. No pneumothorax. There is no significant pleural effusion. IMPRESSION: Cardiomegaly with pulmonary vascular congestion and early interstitial edema. Electronically Signed   By: Katherine Mantle M.D.   On: 11/03/2019 18:11   CT Angio Chest PE W and/or Wo Contrast  Result Date: 11/03/2019 CLINICAL DATA:  Shortness of breath and tachycardia. EXAM: CT ANGIOGRAPHY CHEST WITH CONTRAST TECHNIQUE: Multidetector CT imaging of the chest was performed using the standard protocol during bolus administration of intravenous contrast. Multiplanar CT image reconstructions and MIPs were obtained to evaluate the vascular anatomy. CONTRAST:  OMNIPAQUE IOHEXOL 350 MG/ML SOLN COMPARISON:  None. FINDINGS: Cardiovascular: Satisfactory opacification of the pulmonary arteries to the segmental level. No evidence of pulmonary embolism. Normal heart size. No pericardial effusion. Mediastinum/Nodes: No enlarged mediastinal, hilar, or axillary lymph nodes. Thyroid gland, trachea, and esophagus demonstrate no significant findings. Lungs/Pleura: There is no evidence of acute infiltrate. A small right pleural effusion is seen. No pneumothorax is identified. Upper Abdomen: No acute abnormality. Musculoskeletal: No chest wall abnormality. No acute or significant osseous findings. Review of the MIP images confirms the above findings. IMPRESSION: 1. No CT evidence of acute pulmonary embolism. 2. Small right pleural effusion. Electronically Signed   By: Aram Candela M.D.   On: 11/03/2019 20:17   ECHOCARDIOGRAM COMPLETE  Result Date: 11/04/2019    ECHOCARDIOGRAM REPORT   Patient Name:   Denise Hood Date of Exam: 11/04/2019 Medical Rec #:  [id number]      Height:       60.0 in Accession #:    [id number]     Weight:       189.0 lb Date of Birth:  August 23, 1975      BSA:          1.822 m Patient Age:    43 years       BP:           140/99 mmHg Patient Gender: F              HR:           97 bpm. [facility] Procedure: 2D Echo Indications:    Abnormal ECG 794.31 / R94.31  History:  Patient has no prior history of Echocardiogram examinations.                 Arrythmias:Atrial Flutter. Bacterial  meningitis, Iron deficiency                 anemia.  Sonographer:    Leavy Cella RDCS (AE) Referring Phys: [id number] Abbrielle Batts IMPRESSIONS  1. Left ventricular ejection fraction, by estimation, is <20%. The left ventricle has severely decreased function. The left ventricle demonstrates global hypokinesis. Left ventricular diastolic parameters are consistent with Grade III diastolic dysfunction (restrictive). Elevated left ventricular end-diastolic pressure.  2. Right ventricular systolic function is low normal. The right ventricular size is normal. There is moderately elevated pulmonary artery systolic pressure.  3. Right atrial size was mildly dilated.  4. The mitral valve is grossly normal. Moderate mitral valve regurgitation.  5. Tricuspid valve regurgitation is moderate.  6. The aortic valve is tricuspid. Aortic valve regurgitation is not visualized. No aortic stenosis is present.  7. The inferior vena cava is normal in size with greater than 50% respiratory variability, suggesting right atrial pressure of 3 mmHg. FINDINGS  Left Ventricle: Left ventricular ejection fraction, by estimation, is <20%. The left ventricle has severely decreased function. The left ventricle demonstrates global hypokinesis. The left ventricular internal cavity size was normal in size. There is no  left ventricular hypertrophy. Left ventricular diastolic parameters are consistent with Grade III diastolic dysfunction (restrictive). Elevated left ventricular end-diastolic pressure. Right Ventricle: The right ventricular size is normal. No increase in right ventricular wall thickness. Right ventricular systolic function is low normal. There is moderately elevated pulmonary artery systolic pressure. The tricuspid regurgitant velocity  is 2.80 m/s, and with an assumed right atrial pressure of 10 mmHg, the estimated right ventricular systolic pressure is 91.4 mmHg. Left Atrium: Left atrial size was normal in size. Right Atrium: Right  atrial size was mildly dilated. Pericardium: There is no evidence of pericardial effusion. Mitral Valve: The mitral valve is grossly normal. Moderate mitral valve regurgitation. Tricuspid Valve: The tricuspid valve is grossly normal. Tricuspid valve regurgitation is moderate. Aortic Valve: The aortic valve is tricuspid. Aortic valve regurgitation is not visualized. No aortic stenosis is present. Pulmonic Valve: The pulmonic valve was grossly normal. Pulmonic valve regurgitation is not visualized. Aorta: The aortic root is normal in size and structure. Venous: The inferior vena cava is normal in size with greater than 50% respiratory variability, suggesting right atrial pressure of 3 mmHg. IAS/Shunts: No atrial level shunt detected by color flow Doppler.  LEFT VENTRICLE PLAX 2D LVIDd:         5.02 cm  Diastology LVIDs:         4.44 cm  LV e' lateral:   7.56 cm/s LV PW:         1.18 cm  LV E/e' lateral: 15.5 LV IVS:        0.95 cm  LV e' medial:    3.78 cm/s LVOT diam:     1.80 cm  LV E/e' medial:  31.0 LVOT Area:     2.54 cm  RIGHT VENTRICLE RV S prime:     13.50 cm/s TAPSE (M-mode): 2.4 cm LEFT ATRIUM             Index       RIGHT ATRIUM           Index LA diam:        4.10 cm 2.25 cm/m  RA Area:  18.70 cm LA Vol (A2C):   34.5 ml 18.93 ml/m RA Volume:   56.90 ml  31.22 ml/m LA Vol (A4C):   53.5 ml 29.36 ml/m LA Biplane Vol: 47.1 ml 25.85 ml/m   AORTA Ao Root diam: 2.60 cm MITRAL VALVE                 TRICUSPID VALVE MV Area (PHT): 4.49 cm      TR Peak grad:   31.4 mmHg MV Decel Time: 169 msec      TR Vmax:        280.00 cm/s MR Peak grad:    85.7 mmHg MR Mean grad:    58.0 mmHg   SHUNTS MR Vmax:         463.00 cm/s Systemic Diam: 1.80 cm MR Vmean:        358.0 cm/s MR PISA:         1.01 cm MR PISA Eff ROA: 7 mm MR PISA Radius:  0.40 cm MV E velocity: 117.00 cm/s MV A velocity: 26.70 cm/s MV E/A ratio:  4.38 Prentice Docker MD Electronically signed by Prentice Docker MD Signature Date/Time:  11/04/2019/10:50:04 AM    Final     Micro Results    Recent Results (from the past 240 hour(s))  Respiratory Panel by RT PCR (Flu A&B, Covid) - Nasopharyngeal Swab     Status: None   Collection Time: 11/03/19  8:51 PM   Specimen: Nasopharyngeal Swab  Result Value Ref Range Status   SARS Coronavirus 2 by RT PCR NEGATIVE NEGATIVE Final    Comment: (NOTE) SARS-CoV-2 target nucleic acids are NOT DETECTED. The SARS-CoV-2 RNA is generally detectable in upper respiratoy specimens during the acute phase of infection. The lowest concentration of SARS-CoV-2 viral copies this assay can detect is 131 copies/mL. A negative result does not preclude SARS-Cov-2 infection and should not be used as the sole basis for treatment or other patient management decisions. A negative result may occur with  improper specimen collection/handling, submission of specimen other than nasopharyngeal swab, presence of viral mutation(s) within the areas targeted by this assay, and inadequate number of viral copies (<131 copies/mL). A negative result must be combined with clinical observations, patient history, and epidemiological information. The expected result is Negative. Fact Sheet for Patients:  https://www.moore.com/ Fact Sheet for Healthcare Providers:  https://www.young.biz/ This test is not yet ap proved or cleared by the [place] FDA and  has been authorized for detection and/or diagnosis of SARS-CoV-2 by FDA under an Emergency Use Authorization (EUA). This EUA will remain  in effect (meaning this test can be used) for the duration of the COVID-19 declaration under Section 564(b)(1) of the Act, 21 U.S.C. section 360bbb-3(b)(1), unless the authorization is terminated or revoked sooner.    Influenza A by PCR NEGATIVE NEGATIVE Final   Influenza B by PCR NEGATIVE NEGATIVE Final    Comment: (NOTE) The Xpert Xpress SARS-CoV-2/FLU/RSV assay is intended as an aid in    the diagnosis of influenza from Nasopharyngeal swab specimens and  should not be used as a sole basis for treatment. Nasal washings and  aspirates are unacceptable for Xpert Xpress SARS-CoV-2/FLU/RSV  testing. Fact Sheet for Patients: https://www.moore.com/ Fact Sheet for Healthcare Providers: https://www.young.biz/ This test is not yet approved or cleared by the [place] FDA and  has been authorized for detection and/or diagnosis of SARS-CoV-2 by  FDA under an Emergency Use Authorization (EUA). This EUA will remain  in effect (meaning this test can  be used) for the duration of the  Covid-19 declaration under Section 564(b)(1) of the Act, 21  U.S.C. section 360bbb-3(b)(1), unless the authorization is  terminated or revoked. Performed at [facility], [address] [place], [place] [address]   MRSA PCR Screening     Status: None   Collection Time: 11/04/19 12:11 AM   Specimen: Nasal Mucosa; Nasopharyngeal  Result Value Ref Range Status   MRSA by PCR NEGATIVE NEGATIVE Final    Comment:        The GeneXpert MRSA Assay (FDA approved for NASAL specimens only), is one component of a comprehensive MRSA colonization surveillance program. It is not intended to diagnose MRSA infection nor to guide or monitor treatment for MRSA infections. Performed at [facility], [address], [place], [place] [address]    Today   Subjective    Denise Hood today has no chest pains -No shortness of breath at rest, dyspnea on exertion resolving, no orthopnea -Patient's mother at bedside, questions answered          Patient has been seen and examined prior to discharge   Objective   Blood pressure (!) 137/91, pulse 89, temperature 98.6 F (37 C), temperature source Oral, resp. rate 16, height 5' (1.524 m), weight 85.7 kg, last menstrual period 10/20/2019, SpO2 100 %.   Intake/Output Summary (Last 24 hours) at 11/05/2019 1128 Last data filed at  11/04/2019 1500 Gross per 24 hour  Intake --  Output 1350 ml  Net -1350 ml    Exam Gen:- Awake Alert, no acute distress  HEENT:- [place].AT, No sclera icterus Neck-Supple Neck,No JVD,.  Lungs-  CTAB , good air movement bilaterally  CV- S1, S2 normal, regular Abd-  +ve B.Sounds, Abd Soft, No tenderness,    Extremity/Skin:- No  edema,   good pulses Psych-affect is appropriate, oriented x3 Neuro-no new focal deficits, no tremors    Data Review   CBC w Diff:  Lab Results  Component Value Date   WBC 9.5 11/05/2019   HGB 8.4 (L) 11/05/2019   HCT 29.5 (L) 11/05/2019   PLT 492 (H) 11/05/2019   LYMPHOPCT 25 11/03/2019   MONOPCT 8 11/03/2019   EOSPCT 3 11/03/2019   BASOPCT 1 11/03/2019    CMP:  Lab Results  Component Value Date   NA 136 11/04/2019   K 3.7 11/04/2019   CL 103 11/04/2019   CO2 23 11/04/2019   BUN 17 11/04/2019   CREATININE 0.88 11/04/2019   PROT 5.9 (L) 11/04/2019   ALBUMIN 3.3 (L) 11/04/2019   BILITOT 0.6 11/04/2019   ALKPHOS 105 11/04/2019   AST 60 (H) 11/04/2019   ALT 79 (H) 11/04/2019  .   Total Discharge time is about 33 minutes  Shon Hale M.D on 11/05/2019 at 11:28 AM  Go to [email or web address] -  for contact info  [facility] - Office  [phone]

## 2019-11-20 NOTE — Progress Notes (Signed)
Cardiology Office Note   Date:  11/23/2019   ID:  Denise Hood, DOB [birth year], MRN [id number]  PCP:  [name], MD  Cardiologist:  Dr. [name]     Chief Complaint  Patient presents with  . Hospitalization Follow-up  . Cardiomyopathy      History of Present Illness: Denise Hood is a 44 y.o. female who presents for post hospital 11/03/19 to 11/05/19  She has a history of bacterial meningitis and no prior cardiac history seen 11/04/19 for a flutter in hospital --on IV dilt converted to SR on IV heparin CHA2DS2VASc of 1  She was anemic with hgb of 8.7  No anticoagulation   She had been having bouts of presumed PNA since Nov 2020 and occ rapid heart beats.    EF was <20%  G3 DD restrictive elevated LVEDP mod MR moderate TR  Diuresed and wlil add ACE or entresto depending on BP  Follow up echo in 3 months.  She has no a flutter she is aware of, she now has an apple watch and has only shown SR.  No awareness of rapid HR.  She has not yet had CBC checked for her anemia and she is on Iron.  Some SOB with activity and some fatigue.  She would like t attend cardiac rehab. I will send in order.  She is monitoring her salt intake.  Has not started weighing yet.  No chest pain - she has epigastric pain that occurs with her HF.  It resolved in hospital   Hs TN was 34 and 33.  She has taken an extra 20 of lasix when she feels SOB.  Her epigastric pain has returned.     Past Medical History:  Diagnosis Date  . Bacterial meningitis   . Meningitis     Past Surgical History:  Procedure Laterality Date  . abscess surgery    . TUBAL LIGATION       Current Outpatient Medications  Medication Sig Dispense Refill  . acetaminophen (TYLENOL) 325 MG tablet Take 2 tablets (650 mg total) by mouth every 6 (six) hours as needed for mild pain, fever or headache (or Fever >/= 101). 12 tablet 0  . carvedilol (COREG) 3.125 MG tablet Take 1 tablet (3.125 mg total) by mouth 2 (two) times daily with a  meal. 60 tablet 6  . diazepam (VALIUM) 5 MG tablet Take 5 mg by mouth every 12 (twelve) hours as needed (occipital neuralgia).    . ferrous sulfate 325 (65 FE) MG tablet Take 1 tablet (325 mg total) by mouth 2 (two) times daily with a meal. 60 tablet 3  . furosemide (LASIX) 40 MG tablet Take 1 tablet (40 mg total) by mouth daily. 30 tablet 2  . pantoprazole (PROTONIX) 40 MG tablet Take 1 tablet (40 mg total) by mouth daily before breakfast. 30 tablet 2  . potassium chloride SA (KLOR-CON) 20 MEQ tablet Take 1 tablet (20 mEq total) by mouth daily. 30 tablet 1  . amphetamine-dextroamphetamine (ADDERALL) 20 MG tablet Take 20 mg by mouth 3 (three) times daily.     No current facility-administered medications for this visit.    Allergies:   Dilaudid [hydromorphone] and Fentanyl    Social History:  The patient  reports that she has never smoked. She has never used smokeless tobacco. She reports that she does not drink alcohol or use drugs.   Family History:  The patient's family history includes Hyperlipidemia in her mother.    ROS:  General:no colds or fevers, no weight changes Skin:no rashes or ulcers HEENT:no blurred vision, no congestion CV:see HPI PUL:see HPI GI:no diarrhea constipation or melena, no indigestion GU:no hematuria, no dysuria MS:no joint pain, no claudication Neuro:no syncope, no lightheadedness Endo:no diabetes, no thyroid disease  Wt Readings from Last 3 Encounters:  11/23/19 193 lb (87.5 kg)  11/03/19 189 lb (85.7 kg)     PHYSICAL EXAM: VS:  BP 112/76   Pulse 78   Temp (!) 97 F (36.1 C)   Ht 5' (1.524 m)   Wt 193 lb (87.5 kg)   SpO2 99%   BMI 37.69 kg/m  , BMI Body mass index is 37.69 kg/m. General:Pleasant affect, NAD Skin:Warm and dry, brisk capillary refill HEENT:normocephalic, sclera clear, mucus membranes moist Neck:supple, no JVD, no bruits  Heart:S1S2 RRR without murmur, gallup, rub or click Lungs:clear without rales, rhonchi, or  wheezes DXI:PJAS, + epigastric tenderness, + BS, do not palpate liver spleen or masses Ext:no lower ext edema, 2+ pedal pulses, 2+ radial pulses Neuro:alert and oriented X 3, MAE, follows commands, + facial symmetry    EKG:  EKG is NOT ordered today. The ekg 11/04/19 demonstrates SR and no acute ST changes.    Recent Labs: 11/03/2019: Magnesium 1.8; TSH 3.334 11/04/2019: ALT 79; BUN 17; Creatinine, Ser 0.88; Potassium 3.7; Sodium 136 11/05/2019: Hemoglobin 8.4; Platelets 492    Lipid Panel No results found for: CHOL, TRIG, HDL, CHOLHDL, VLDL, LDLCALC, LDLDIRECT     Other studies Reviewed: Additional studies/ records that were reviewed today include: . ECHO IMPRESSIONS    1. Left ventricular ejection fraction, by estimation, is <20%. The left  ventricle has severely decreased function. The left ventricle demonstrates  global hypokinesis. Left ventricular diastolic parameters are consistent  with Grade III diastolic  dysfunction (restrictive). Elevated left ventricular end-diastolic  pressure.  2. Right ventricular systolic function is low normal. The right  ventricular size is normal. There is moderately elevated pulmonary artery  systolic pressure.  3. Right atrial size was mildly dilated.  4. The mitral valve is grossly normal. Moderate mitral valve  regurgitation.  5. Tricuspid valve regurgitation is moderate.  6. The aortic valve is tricuspid. Aortic valve regurgitation is not  visualized. No aortic stenosis is present.  7. The inferior vena cava is normal in size with greater than 50%  respiratory variability, suggesting right atrial pressure of 3 mmHg.   FINDINGS  Left Ventricle: Left ventricular ejection fraction, by estimation, is  <20%. The left ventricle has severely decreased function. The left  ventricle demonstrates global hypokinesis. The left ventricular internal  cavity size was normal in size. There is no  left ventricular hypertrophy. Left  ventricular diastolic parameters are  consistent with Grade III diastolic dysfunction (restrictive). Elevated  left ventricular end-diastolic pressure.   Right Ventricle: The right ventricular size is normal. No increase in  right ventricular wall thickness. Right ventricular systolic function is  low normal. There is moderately elevated pulmonary artery systolic  pressure. The tricuspid regurgitant velocity  is 2.80 m/s, and with an assumed right atrial pressure of 10 mmHg, the  estimated right ventricular systolic pressure is 50.5 mmHg.   Left Atrium: Left atrial size was normal in size.   Right Atrium: Right atrial size was mildly dilated.   Pericardium: There is no evidence of pericardial effusion.   Mitral Valve: The mitral valve is grossly normal. Moderate mitral valve  regurgitation.   Tricuspid Valve: The tricuspid valve is grossly normal. Tricuspid valve  regurgitation is moderate.   Aortic Valve: The aortic valve is tricuspid. Aortic valve regurgitation is  not visualized. No aortic stenosis is present.   Pulmonic Valve: The pulmonic valve was grossly normal. Pulmonic valve  regurgitation is not visualized.   Aorta: The aortic root is normal in size and structure.   Venous: The inferior vena cava is normal in size with greater than 50%  respiratory variability, suggesting right atrial pressure of 3 mmHg.   IAS/Shunts: No atrial level shunt detected by color flow Doppler.      ASSESSMENT AND PLAN:  1.  Cardiomyopathy prob viral.  EF 20%, on coreg 3.125 BID, will check Cr and K+ and if stable add entresto will plan for follow up in 2 weeks to increase.  Will check with Dr. Marcelo Baldy for need for nuc study or cardiac CTA.  Though possible cardiac MRI with no specific etiology of CM.   May be prudent and wait for 3 month follow up echo and if not resolved then proceed.  She has no chest pain.  Consider spironolactone at next visit.  She will increase activity.  Will  order cardiac rehab.  Hospital labs and notes and studies reviewed.   2.  Chronic systolic HF takes and extra 20 mg lasix prn. And 40 mg daily. Check labs today , once we increase entresto and BB may be able to reduce Lasix.   LFTs were elevated in hospital presumed to HF will recheck especially with epigastric pain.   3.  Epigastric pain, may be from pleural effusion + tenderness to palpation she is on PPI   4.  A flutter- paroxysmal no further episodes, monitors with apple watch.  Once on entresto will increase BB if BP allows.    5.  Anemia Iron def.  Will recheck CBC - anemia with CM will increase her HF.   Current medicines are reviewed with the patient today.  The patient Has no concerns regarding medicines.  The following changes have been made:  See above Labs/ tests ordered today include:see above  Disposition:   FU:  see above  Signed, Nada Boozer, NP  11/23/2019 3:12 PM    [facility] [address] [place], [place], [place]  [address]/ [address] [place], [place] Phone: [phone]; Fax: [phone]  [phone]

## 2019-11-23 ENCOUNTER — Encounter: Payer: Self-pay | Admitting: Cardiology

## 2019-11-23 ENCOUNTER — Ambulatory Visit (INDEPENDENT_AMBULATORY_CARE_PROVIDER_SITE_OTHER): Payer: Self-pay | Admitting: Cardiology

## 2019-11-23 ENCOUNTER — Other Ambulatory Visit: Payer: Self-pay

## 2019-11-23 VITALS — BP 112/76 | HR 78 | Temp 97.0°F | Ht 60.0 in | Wt 193.0 lb

## 2019-11-23 DIAGNOSIS — I5022 Chronic systolic (congestive) heart failure: Secondary | ICD-10-CM

## 2019-11-23 DIAGNOSIS — I429 Cardiomyopathy, unspecified: Secondary | ICD-10-CM

## 2019-11-23 DIAGNOSIS — R1013 Epigastric pain: Secondary | ICD-10-CM

## 2019-11-23 DIAGNOSIS — R7989 Other specified abnormal findings of blood chemistry: Secondary | ICD-10-CM

## 2019-11-23 DIAGNOSIS — I4892 Unspecified atrial flutter: Secondary | ICD-10-CM

## 2019-11-23 DIAGNOSIS — D509 Iron deficiency anemia, unspecified: Secondary | ICD-10-CM

## 2019-11-23 NOTE — Patient Instructions (Signed)
Medication Instructions:  Your physician recommends that you continue on your current medications as directed. Please refer to the Current Medication list given to you today.  *If you need a refill on your cardiac medications before your next appointment, please call your pharmacy*   Lab Work: BNP,Iron, CMET   If you have labs (blood work) drawn today and your tests are completely normal, you will receive your results only by: Marland Kitchen MyChart Message (if you have MyChart) OR . A paper copy in the mail If you have any lab test that is abnormal or we need to change your treatment, we will call you to review the results.   Testing/Procedures: None today   Follow-Up: At [facility], you and your health needs are our priority.  As part of our continuing mission to provide you with exceptional heart care, we have created designated Provider Care Teams.  These Care Teams include your primary Cardiologist (physician) and Advanced Practice Providers (APPs -  Physician Assistants and Nurse Practitioners) who all work together to provide you with the care you need, when you need it.  We recommend signing up for the patient portal called "MyChart".  Sign up information is provided on this After Visit Summary.  MyChart is used to connect with patients for Virtual Visits (Telemedicine).  Patients are able to view lab/test results, encounter notes, upcoming appointments, etc.  Non-urgent messages can be sent to your provider as well.   To learn more about what you can do with MyChart, go to [email or web address].    Your next appointment:   2 week(s)  The format for your next appointment:   In Person  Provider:   You may see  one of the following Advanced Practice Providers on your designated Care Team:    Randall An, PA-C   Jacolyn Reedy, PA-C     Other Instructions None   Thank you for choosing [facility] !

## 2019-11-24 ENCOUNTER — Encounter: Payer: Self-pay | Admitting: Cardiology

## 2019-11-24 ENCOUNTER — Other Ambulatory Visit (HOSPITAL_COMMUNITY)
Admission: RE | Admit: 2019-11-24 | Discharge: 2019-11-24 | Disposition: A | Discharge status: Home / Self Care | Payer: Self-pay | Source: Ambulatory Visit | Attending: Cardiology | Admitting: Cardiology

## 2019-11-24 DIAGNOSIS — R7989 Other specified abnormal findings of blood chemistry: Secondary | ICD-10-CM | POA: Insufficient documentation

## 2019-11-24 DIAGNOSIS — D509 Iron deficiency anemia, unspecified: Secondary | ICD-10-CM | POA: Insufficient documentation

## 2019-11-24 LAB — COMPREHENSIVE METABOLIC PANEL
ALT: 14 U/L (ref 0–44)
AST: 18 U/L (ref 15–41)
Albumin: 3.8 g/dL (ref 3.5–5.0)
Alkaline Phosphatase: 61 U/L (ref 38–126)
Anion gap: 7 (ref 5–15)
BUN: 15 mg/dL (ref 6–20)
CO2: 26 mmol/L (ref 22–32)
Calcium: 8.7 mg/dL — ABNORMAL LOW (ref 8.9–10.3)
Chloride: 101 mmol/L (ref 98–111)
Creatinine, Ser: 0.73 mg/dL (ref 0.44–1.00)
GFR calc Af Amer: >60 mL/min (ref >60)
GFR calc non Af Amer: >60 mL/min (ref >60)
Glucose, Bld: 175 mg/dL — ABNORMAL HIGH (ref 70–99)
Potassium: 3.5 mmol/L (ref 3.5–5.1)
Sodium: 134 mmol/L — ABNORMAL LOW (ref 135–145)
Total Bilirubin: 0.7 mg/dL (ref 0.3–1.2)
Total Protein: 6.9 g/dL (ref 6.5–8.1)

## 2019-11-24 LAB — IRON: Iron: 42 ug/dL (ref 28–170)

## 2019-11-24 LAB — BRAIN NATRIURETIC PEPTIDE: B Natriuretic Peptide: 75 pg/mL (ref 0.0–100.0)

## 2019-11-26 ENCOUNTER — Telehealth: Payer: Self-pay | Admitting: Cardiology

## 2019-11-26 ENCOUNTER — Telehealth: Payer: Self-pay | Admitting: *Deleted

## 2019-11-26 DIAGNOSIS — Z79899 Other long term (current) drug therapy: Secondary | ICD-10-CM

## 2019-11-26 DIAGNOSIS — I4892 Unspecified atrial flutter: Secondary | ICD-10-CM

## 2019-11-26 MED ORDER — ENTRESTO 24-26 MG PO TABS: 1.0000 | ORAL_TABLET | Freq: Two times a day (BID) | ORAL | 11 refills | Status: DC

## 2019-11-26 NOTE — Telephone Encounter (Signed)
-----   Message from Leone Brand, NP sent at 11/24/2019 10:15 PM EDT ----- Labs are stable Have pt take an extra 20 meq of K+ on Wednesday.  Then begin entresto 24/26 1 twice a day and recheck BMP in 2 weeks.  Thanks

## 2019-11-26 NOTE — Telephone Encounter (Signed)
Pt called stating her heart rate has been in the 40's-50's today since starting the [facility].   Pt's wanting to know if she should take the Entreseto and Coreg this evening since her heart rate is so low, states she hasn't taken the Lasix.   [phone]

## 2019-11-27 ENCOUNTER — Telehealth: Payer: Self-pay

## 2019-11-27 NOTE — Telephone Encounter (Signed)
Pt states that she took 1 dose of entresto and her heart rate dropped in the 50's. She was laying down a lot all day and wonders if that may be reason her heart rate dropped from her nearly 70's. She took entresto same time as coreg and wonders if she needs to space them out. She is holding entresto until she hears back.Please advise.

## 2019-11-27 NOTE — Telephone Encounter (Signed)
Will forward to L. Ingold,NP. To advise.

## 2019-11-27 NOTE — Telephone Encounter (Signed)
Pt states she has fluttering from time to time.  Please call [phone]   Thanks renee

## 2019-11-29 NOTE — Telephone Encounter (Signed)
Yes continue entresto, it does not decrease the heart rate.  Have her wear a zio patch for 14 day active one.  To further eval.  May be premature beats the apple watch does not pick up.

## 2019-11-29 NOTE — Telephone Encounter (Signed)
See previous note

## 2019-11-30 NOTE — Telephone Encounter (Signed)
Called pt., no answer. Left message for pt to return call.  

## 2019-11-30 NOTE — Telephone Encounter (Signed)
Spoke with pt. Informed her physician's recommendations. She voiced understanding. Will come in this afternoon to have live zio patch placed. She is still having issues of low HR.

## 2019-12-02 ENCOUNTER — Telehealth: Payer: Self-pay

## 2019-12-02 NOTE — Telephone Encounter (Signed)
reerral placed

## 2019-12-02 NOTE — Telephone Encounter (Signed)
-----   Message from Laqueta Linden, MD sent at 12/02/2019 11:12 AM EDT ----- Regarding: FW: Referral Cathey, if you put in I will sign. ----- Message ----- From: Rolene Course Sent: 12/02/2019  11:05 AM EDT To: Laqueta Linden, MD Subject: Referral                                       Dr. Purvis Sheffield,  We received a note from Nada Boozer to get this patient in the CR program due to Systolic CHF. Could you please place a referral in Epic for this patient so that we can get her started. Thank so very much Diane Honeywell

## 2019-12-03 ENCOUNTER — Telehealth: Payer: Self-pay

## 2019-12-04 ENCOUNTER — Telehealth: Payer: Self-pay | Admitting: *Deleted

## 2019-12-04 DIAGNOSIS — D509 Iron deficiency anemia, unspecified: Secondary | ICD-10-CM

## 2019-12-04 NOTE — Telephone Encounter (Signed)
-----   Message from Leone Brand, NP sent at 11/24/2019 10:18 PM EDT ----- Sorry I wanted a CBC also for anemia, I must have forgotten to say so.   If she is to see PCP this week or next they could draw. Her Iron is better.  If not seeing PCP then she needs CBC for anemia thanks.

## 2019-12-04 NOTE — Telephone Encounter (Signed)
Patient notified

## 2019-12-08 ENCOUNTER — Other Ambulatory Visit (HOSPITAL_COMMUNITY)
Admission: RE | Admit: 2019-12-08 | Discharge: 2019-12-08 | Disposition: A | Discharge status: Home / Self Care | Payer: Self-pay | Source: Ambulatory Visit | Attending: Cardiology | Admitting: Cardiology

## 2019-12-08 DIAGNOSIS — D509 Iron deficiency anemia, unspecified: Secondary | ICD-10-CM | POA: Insufficient documentation

## 2019-12-08 LAB — CBC
HCT: 37.6 % (ref 36.0–46.0)
Hemoglobin: 11.3 g/dL — ABNORMAL LOW (ref 12.0–15.0)
MCH: 24 pg — ABNORMAL LOW (ref 26.0–34.0)
MCHC: 30.1 g/dL (ref 30.0–36.0)
MCV: 80 fL (ref 80.0–100.0)
Platelets: 405 10*3/uL — ABNORMAL HIGH (ref 150–400)
RBC: 4.7 MIL/uL (ref 3.87–5.11)
RDW: 18 % — ABNORMAL HIGH (ref 11.5–15.5)
WBC: 9.5 10*3/uL (ref 4.0–10.5)
nRBC: 0 % (ref 0.0–0.2)

## 2019-12-08 NOTE — Progress Notes (Signed)
Cardiology Office Note    Date:  12/09/2019   ID:  Denise Hood, DOB 12/29/1975, MRN [id number]  PCP:  [name], MD  Cardiologist: Prentice Docker, MD    Chief Complaint  Patient presents with  . Follow-up    2 week visit    History of Present Illness:    Denise Hood is a 44 y.o. female with past medical history of recently diagnosed systolic heart failure/cardiomyopathy (EF 15 to 20% by echocardiogram in 10/2019), paroxysmal atrial flutter (new diagnosis for the patient in 10/2019) and anemia who presents to the office today for 2-week follow-up.  Recently admitted to [facility] on 11/03/2019 for evaluation of worsening dyspnea over the past several months and also reported intermittent palpitations for the past 4 to 5 days leading up to admission. She was found to be in new onset atrial flutter with RVR while in the ED and converted to normal sinus rhythm while on IV Cardizem. Echocardiogram did show a reduced EF of 15 to 20% with restrictive diastolic dysfunction and moderate MR and TR. She was transitioned from Cardizem and to Coreg 3.125mg  BID with consideration of ACE-I or ARB as an outpatient if BP allowed. Was also started on Lasix 40mg  daily. It was recommended for her to continue medical therapy for minimum of 3 months then to obtain a repeat echocardiogram for reassessment of EF.  She was evaluated by , NP on 11/23/2019 and denied any recurrent palpitations since hospital discharge. She did report having dyspnea on exertion with associated fatigue. Labs were obtained and showed creatinine was stable at 0.73, therefore was recommended she start Entresto 24-26mg  BID. She had a follow-up CBC on 12/08/2019 which showed hemoglobin had improved to 11.3. She did call in the interim with reports of heart rate in the 50's at times, therefore a 2-week monitor was arranged.  In talking with the patient today, she reports overall doing well since her last visit. Reports her  breathing has significantly improved over the past several weeks as compared to the past 5+ months. She denies any recent chest pain or palpitations. She does have an iWatch and this has shown she has been in normal sinus rhythm when checked intermittently. She does report her heart rate increases into the 110's with activity but returns to a normal range with rest.  Denies any dizziness or presyncope. No recent orthopnea, PND or lower extremity edema.  SBP has been in the low 100's to 110's since starting Entresto.  Past Medical History:  Diagnosis Date  . Atrial flutter with rapid ventricular response (HCC) 11/03/2019  . Bacterial meningitis   . Cardiomyopathy (HCC) 11/04/2019   a. EF 15 to 20% by echocardiogram in 10/2019  . Meningitis     Past Surgical History:  Procedure Laterality Date  . abscess surgery    . TUBAL LIGATION      Current Medications: Outpatient Medications Prior to Visit  Medication Sig Dispense Refill  . acetaminophen (TYLENOL) 325 MG tablet Take 2 tablets (650 mg total) by mouth every 6 (six) hours as needed for mild pain, fever or headache (or Fever >/= 101). 12 tablet 0  . amphetamine-dextroamphetamine (ADDERALL) 20 MG tablet Take 20 mg by mouth 3 (three) times daily.    . carvedilol (COREG) 3.125 MG tablet Take 1 tablet (3.125 mg total) by mouth 2 (two) times daily with a meal. 60 tablet 6  . diazepam (VALIUM) 5 MG tablet Take 5 mg by mouth every 12 (twelve)  hours as needed (occipital neuralgia).    . ferrous sulfate 325 (65 FE) MG tablet Take 1 tablet (325 mg total) by mouth 2 (two) times daily with a meal. 60 tablet 3  . furosemide (LASIX) 40 MG tablet Take 1 tablet (40 mg total) by mouth daily. 30 tablet 2  . pantoprazole (PROTONIX) 40 MG tablet Take 1 tablet (40 mg total) by mouth daily before breakfast. 30 tablet 2  . potassium chloride SA (KLOR-CON) 20 MEQ tablet Take 1 tablet (20 mEq total) by mouth daily. 30 tablet 1  . sacubitril-valsartan (ENTRESTO)  24-26 MG Take 1 tablet by mouth 2 (two) times daily. 60 tablet 11   No facility-administered medications prior to visit.     Allergies:   Dilaudid [hydromorphone] and Fentanyl   Social History   Socioeconomic History  . Marital status: Married    Spouse name: Not on file  . Number of children: Not on file  . Years of education: Not on file  . Highest education level: Not on file  Occupational History  . Not on file  Tobacco Use  . Smoking status: Never Smoker  . Smokeless tobacco: Never Used  Substance and Sexual Activity  . Alcohol use: No  . Drug use: No  . Sexual activity: Not on file  Other Topics Concern  . Not on file  Social History Narrative  . Not on file   Social Determinants of Health   Financial Resource Strain:   . Difficulty of Paying Living Expenses:   Food Insecurity:   . Worried About Programme researcher, broadcasting/film/video in the Last Year:   . Barista in the Last Year:   Transportation Needs:   . Freight forwarder (Medical):   Marland Kitchen Lack of Transportation (Non-Medical):   Physical Activity:   . Days of Exercise per Week:   . Minutes of Exercise per Session:   Stress:   . Feeling of Stress :   Social Connections:   . Frequency of Communication with Friends and Family:   . Frequency of Social Gatherings with Friends and Family:   . Attends Religious Services:   . Active Member of Clubs or Organizations:   . Attends Banker Meetings:   Marland Kitchen Marital Status:      Family History:  The patient's family history includes Hyperlipidemia in her mother.   Review of Systems:   Please see the history of present illness.     General:  No chills, fever, night sweats or weight changes.  Cardiovascular:  No chest pain, edema, orthopnea, palpitations, paroxysmal nocturnal dyspnea. Positive for dyspnea on exertion (improving).  Dermatological: No rash, lesions/masses Respiratory: No cough, dyspnea Urologic: No hematuria, dysuria Abdominal:   No nausea,  vomiting, diarrhea, bright red blood per rectum, melena, or hematemesis Neurologic:  No visual changes, wkns, changes in mental status. All other systems reviewed and are otherwise negative except as noted above.   Physical Exam:    VS:  BP 116/88   Pulse 86   Temp 98.7 F (37.1 C)   Ht 5' (1.524 m)   Wt 189 lb (85.7 kg)   SpO2 98%   BMI 36.91 kg/m    General: Well developed, well nourished,female appearing in no acute distress. Head: Normocephalic, atraumatic, sclera non-icteric.  Neck: No carotid bruits. JVD not elevated.  Lungs: Respirations regular and unlabored, without wheezes or rales.  Heart: Regular rate and rhythm. No S3 or S4.  No murmur, no rubs, or gallops  appreciated. Abdomen: Soft, non-tender, non-distended. No obvious abdominal masses. Msk:  Strength and tone appear normal for age. No obvious joint deformities or effusions. Extremities: No clubbing or cyanosis. No lower extremity edema.  Distal pedal pulses are 2+ bilaterally. Neuro: Alert and oriented X 3. Moves all extremities spontaneously. No focal deficits noted. Psych:  Responds to questions appropriately with a normal affect. Skin: No rashes or lesions noted  Wt Readings from Last 3 Encounters:  12/09/19 189 lb (85.7 kg)  11/23/19 193 lb (87.5 kg)  11/03/19 189 lb (85.7 kg)     Studies/Labs Reviewed:   EKG:  EKG is not ordered today.   Recent Labs: 11/03/2019: Magnesium 1.8; TSH 3.334 11/24/2019: ALT 14; B Natriuretic Peptide 75.0; BUN 15; Creatinine, Ser 0.73; Potassium 3.5; Sodium 134 12/08/2019: Hemoglobin 11.3; Platelets 405   Lipid Panel No results found for: CHOL, TRIG, HDL, CHOLHDL, VLDL, LDLCALC, LDLDIRECT  Additional studies/ records that were reviewed today include:   Echocardiogram: 10/2019 IMPRESSIONS    1. Left ventricular ejection fraction, by estimation, is <20%. The left  ventricle has severely decreased function. The left ventricle demonstrates  global hypokinesis. Left  ventricular diastolic parameters are consistent  with Grade III diastolic  dysfunction (restrictive). Elevated left ventricular end-diastolic  pressure.  2. Right ventricular systolic function is low normal. The right  ventricular size is normal. There is moderately elevated pulmonary artery  systolic pressure.  3. Right atrial size was mildly dilated.  4. The mitral valve is grossly normal. Moderate mitral valve  regurgitation.  5. Tricuspid valve regurgitation is moderate.  6. The aortic valve is tricuspid. Aortic valve regurgitation is not  visualized. No aortic stenosis is present.  7. The inferior vena cava is normal in size with greater than 50%  respiratory variability, suggesting right atrial pressure of 3 mmHg.   Assessment:    1. Chronic systolic HF (heart failure) ([place])   2. Nonischemic cardiomyopathy ([place])   3. Atrial flutter, unspecified type ([place])   4. Iron deficiency anemia, unspecified iron deficiency anemia type   5. 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 Chronic Systolic CHF/ Presumed NICM - Echocardiogram last month showed a reduced EF of 15 to 20% which was thought to possibly be tachycardia mediated or viral mediated as she denied any recent chest pain.  - Her breathing has significantly improved and she denies any recent orthopnea, PND or lower extremity edema. Appears euvolemic by examination today. - She is currently on Coreg 3.125 mg twice daily, Entresto 24-26mg  BID and Lasix 40mg  daily. I am hesitant to further titrate Entresto given reports of SBP at 100 when checked at home.  Pending results of her monitor, would attempt to titrate Coreg to 6.25 mg twice daily. She is currently listed as not having insurance coverage but says this is being evaluated by her husband's workplace. Was provided with information for patient assistance today given the use of Entresto.  - Will plan to  obtain a repeat echocardiogram in 2 months to reassess EF and wall motion.  If EF remains reduced, would anticipate a R/LHC at that time.  2. Paroxysmal Atrial Flutter - This was diagnosed during her recent admission and she did convert to normal sinus rhythm while on IV Cardizem. She denies any recurrent palpitations. She does have a monitor in place given reports of bradycardia. Will follow up on results once available. She is currently  on Coreg 3.125 mg twice daily and pending monitor results, would like to attempt to titrate to 6.25 mg twice daily unless she is found to have bradycardia. - This patients CHA2DS2-VASc Score and unadjusted Ischemic Stroke Rate (% per year) is equal to 2.2 % stroke rate/year from a score of 2 (CHF, Female).  She was not started on anticoagulation during admission given her brief arrhythmia episode along with anemia.  3. Anemia - Hgb was down to 8.4 during recent admission, improved to 11.3 by recent labs on 12/08/2019. Continue PO iron supplementation.   4. Valvular Heart Disease - Echocardiogram during admission showed moderate MR and moderate TR. Will plan to obtain a repeat echocardiogram as outlined above.   Medication Adjustments/Labs and Tests Ordered: Current medicines are reviewed at length with the patient today.  Concerns regarding medicines are outlined above.  Medication changes, Labs and Tests ordered today are listed in the Patient Instructions below. Patient Instructions  Medication Instructions:  Your physician recommends that you continue on your current medications as directed. Please refer to the Current Medication list given to you today.  *If you need a refill on your cardiac medications before your next appointment, please call your pharmacy*   Lab Work: NONE   If you have labs (blood work) drawn today and your tests are completely normal, you will receive your results only by: Marland Kitchen MyChart Message (if you have MyChart) OR . A paper copy  in the mail If you have any lab test that is abnormal or we need to change your treatment, we will call you to review the results.   Testing/Procedures: Your physician has requested that you have an echocardiogram. Echocardiography is a painless test that uses sound waves to create images of your heart. It provides your doctor with information about the size and shape of your heart and how well your heart's chambers and valves are working. This procedure takes approximately one hour. There are no restrictions for this procedure.     Follow-Up: At [facility], you and your health needs are our priority.  As part of our continuing mission to provide you with exceptional heart care, we have created designated Provider Care Teams.  These Care Teams include your primary Cardiologist (physician) and Advanced Practice Providers (APPs -  Physician Assistants and Nurse Practitioners) who all work together to provide you with the care you need, when you need it.  We recommend signing up for the patient portal called "MyChart".  Sign up information is provided on this After Visit Summary.  MyChart is used to connect with patients for Virtual Visits (Telemedicine).  Patients are able to view lab/test results, encounter notes, upcoming appointments, etc.  Non-urgent messages can be sent to your provider as well.   To learn more about what you can do with MyChart, go to [email or web address].    Your next appointment:   2 month(s)  The format for your next appointment:   In Person  Provider:   Randall An, PA-C   Other Instructions Thank you for choosing [facility]!       Signed, Ellsworth Lennox, PA-C  12/09/2019 5:51 PM    [facility] [address] [place], [place] [address] Phone: [phone] Fax: [phone]

## 2019-12-09 ENCOUNTER — Ambulatory Visit (INDEPENDENT_AMBULATORY_CARE_PROVIDER_SITE_OTHER): Payer: Self-pay | Admitting: Student

## 2019-12-09 ENCOUNTER — Encounter: Payer: Self-pay | Admitting: Student

## 2019-12-09 ENCOUNTER — Other Ambulatory Visit: Payer: Self-pay

## 2019-12-09 VITALS — BP 116/88 | HR 86 | Temp 98.7°F | Ht 60.0 in | Wt 189.0 lb

## 2019-12-09 DIAGNOSIS — I38 Endocarditis, valve unspecified: Secondary | ICD-10-CM

## 2019-12-09 DIAGNOSIS — I428 Other cardiomyopathies: Secondary | ICD-10-CM

## 2019-12-09 DIAGNOSIS — I4892 Unspecified atrial flutter: Secondary | ICD-10-CM

## 2019-12-09 DIAGNOSIS — I5022 Chronic systolic (congestive) heart failure: Secondary | ICD-10-CM

## 2019-12-09 DIAGNOSIS — D509 Iron deficiency anemia, unspecified: Secondary | ICD-10-CM

## 2019-12-09 MED ORDER — ENTRESTO 24-26 MG PO TABS: 1.0000 | ORAL_TABLET | Freq: Two times a day (BID) | ORAL | 0 refills | Status: DC

## 2019-12-09 NOTE — Patient Instructions (Signed)
Medication Instructions:  Your physician recommends that you continue on your current medications as directed. Please refer to the Current Medication list given to you today.  *If you need a refill on your cardiac medications before your next appointment, please call your pharmacy*   Lab Work: NONE   If you have labs (blood work) drawn today and your tests are completely normal, you will receive your results only by: Marland Kitchen MyChart Message (if you have MyChart) OR . A paper copy in the mail If you have any lab test that is abnormal or we need to change your treatment, we will call you to review the results.   Testing/Procedures: Your physician has requested that you have an echocardiogram. Echocardiography is a painless test that uses sound waves to create images of your heart. It provides your doctor with information about the size and shape of your heart and how well your heart's chambers and valves are working. This procedure takes approximately one hour. There are no restrictions for this procedure.     Follow-Up: At [facility], you and your health needs are our priority.  As part of our continuing mission to provide you with exceptional heart care, we have created designated Provider Care Teams.  These Care Teams include your primary Cardiologist (physician) and Advanced Practice Providers (APPs -  Physician Assistants and Nurse Practitioners) who all work together to provide you with the care you need, when you need it.  We recommend signing up for the patient portal called "MyChart".  Sign up information is provided on this After Visit Summary.  MyChart is used to connect with patients for Virtual Visits (Telemedicine).  Patients are able to view lab/test results, encounter notes, upcoming appointments, etc.  Non-urgent messages can be sent to your provider as well.   To learn more about what you can do with MyChart, go to [email or web address].    Your next appointment:   2  month(s)  The format for your next appointment:   In Person  Provider:   Randall An, PA-C   Other Instructions Thank you for choosing [facility]!

## 2020-01-04 ENCOUNTER — Ambulatory Visit (INDEPENDENT_AMBULATORY_CARE_PROVIDER_SITE_OTHER): Payer: Self-pay

## 2020-01-04 ENCOUNTER — Other Ambulatory Visit: Payer: Self-pay

## 2020-01-04 DIAGNOSIS — I4892 Unspecified atrial flutter: Secondary | ICD-10-CM

## 2020-01-10 ENCOUNTER — Other Ambulatory Visit: Payer: Self-pay | Admitting: Student

## 2020-01-13 ENCOUNTER — Telehealth: Payer: Self-pay | Admitting: Cardiovascular Disease

## 2020-01-13 ENCOUNTER — Telehealth: Payer: Self-pay | Admitting: *Deleted

## 2020-01-13 ENCOUNTER — Telehealth: Payer: Self-pay | Admitting: Student

## 2020-01-13 MED ORDER — CARVEDILOL 6.25 MG PO TABS: 6.2500 mg | ORAL_TABLET | Freq: Two times a day (BID) | ORAL | 3 refills | Status: DC

## 2020-01-13 MED ORDER — ENTRESTO 24-26 MG PO TABS: 1.0000 | ORAL_TABLET | Freq: Two times a day (BID) | ORAL | 0 refills | Status: DC

## 2020-01-13 NOTE — Telephone Encounter (Signed)
Patient has walked into office requesting samples of Entresto  24-26 mg

## 2020-01-13 NOTE — Telephone Encounter (Signed)
Pt's event monitor reading stated stay on same meds for now, pt made aware

## 2020-01-13 NOTE — Telephone Encounter (Signed)
-----   Message from Ellsworth Lennox, [place] sent at 01/07/2020  8:17 PM EDT ----- Regarding: RE: Coreg Yes, can titrate Coreg to 6.25 mg twice daily. I would encourage her to monitor HR and BP at home if able.  Thanks,  [place]  ----- Message ----- From: Kerney Elbe, LPN Sent: 5/64/3329   4:57 PM EDT To: Ellsworth Lennox, PA-C Subject: Coreg                                          Pt given monitor results. Pt asked if her Coreg would be increased d/t SVT ?

## 2020-01-13 NOTE — Telephone Encounter (Signed)
Pt would like to know if she's supposed to have an increase in her carvedilol (COREG) 3.125 MG tablet [[id number]]

## 2020-01-13 NOTE — Telephone Encounter (Signed)
1 bottle provided - asked that she call ahead next time, as we do not always have these in the office. Lynden Ang will notify patient.

## 2020-01-13 NOTE — Telephone Encounter (Signed)
Pt notified and voiced understanding 

## 2020-01-27 ENCOUNTER — Telehealth: Payer: Self-pay

## 2020-01-27 NOTE — Telephone Encounter (Signed)
Pt requesting entresto samples   Please [phone]   Thanks renee

## 2020-01-28 ENCOUNTER — Other Ambulatory Visit: Payer: Self-pay | Admitting: *Deleted

## 2020-01-28 MED ORDER — ENTRESTO 24-26 MG PO TABS: 1.0000 | ORAL_TABLET | Freq: Two times a day (BID) | ORAL | 0 refills | Status: DC

## 2020-01-28 NOTE — Telephone Encounter (Signed)
Pt assistance forms faxed. 

## 2020-02-05 ENCOUNTER — Telehealth: Payer: Self-pay

## 2020-02-05 NOTE — Telephone Encounter (Signed)
[employer] has approved patient for free entresto thru 08/19/20.The pharmacy will take 2-3 days to process and they will contact patient

## 2020-02-05 NOTE — Telephone Encounter (Signed)
New message    [facility] called to find out if patient has pharmacy benefits   With someone other than [facility], please call   They tried reaching out to patient and has not been able to contact her

## 2020-02-09 ENCOUNTER — Telehealth: Payer: Self-pay | Admitting: *Deleted

## 2020-02-09 ENCOUNTER — Ambulatory Visit (HOSPITAL_COMMUNITY)
Admission: RE | Admit: 2020-02-09 | Discharge: 2020-02-09 | Disposition: A | Discharge status: Home / Self Care | Payer: BC Managed Care – PPO | Source: Ambulatory Visit | Attending: Student | Admitting: Student

## 2020-02-09 ENCOUNTER — Other Ambulatory Visit: Payer: Self-pay

## 2020-02-09 DIAGNOSIS — I428 Other cardiomyopathies: Secondary | ICD-10-CM | POA: Insufficient documentation

## 2020-02-09 NOTE — Progress Notes (Signed)
*  PRELIMINARY RESULTS* Echocardiogram 2D Echocardiogram has been performed.  Stacey Drain 02/09/2020, 10:22 AM

## 2020-02-09 NOTE — Telephone Encounter (Signed)
Pt notified that she was approved for [employer] till 08/19/2020.

## 2020-02-16 ENCOUNTER — Ambulatory Visit: Payer: Self-pay | Admitting: Student

## 2020-02-18 ENCOUNTER — Other Ambulatory Visit: Payer: Self-pay

## 2020-02-18 ENCOUNTER — Ambulatory Visit (INDEPENDENT_AMBULATORY_CARE_PROVIDER_SITE_OTHER): Payer: BC Managed Care – PPO | Admitting: Cardiology

## 2020-02-18 ENCOUNTER — Encounter: Payer: Self-pay | Admitting: Cardiology

## 2020-02-18 VITALS — BP 118/82 | HR 83 | Ht 60.0 in | Wt 200.0 lb

## 2020-02-18 DIAGNOSIS — I428 Other cardiomyopathies: Secondary | ICD-10-CM

## 2020-02-18 DIAGNOSIS — I483 Typical atrial flutter: Secondary | ICD-10-CM

## 2020-02-18 DIAGNOSIS — I5022 Chronic systolic (congestive) heart failure: Secondary | ICD-10-CM | POA: Diagnosis not present

## 2020-02-18 DIAGNOSIS — Z79899 Other long term (current) drug therapy: Secondary | ICD-10-CM

## 2020-02-18 MED ORDER — POTASSIUM CHLORIDE CRYS ER 20 MEQ PO TBCR: 20.0000 meq | EXTENDED_RELEASE_TABLET | Freq: Every day | ORAL | 3 refills | Status: DC

## 2020-02-18 MED ORDER — FUROSEMIDE 40 MG PO TABS: 40.0000 mg | ORAL_TABLET | Freq: Every day | ORAL | 3 refills | Status: DC

## 2020-02-18 NOTE — Progress Notes (Signed)
Cardiology Office Note  Date: 02/18/2020   ID: Denise Hood, DOB 1976/01/06, MRN [id number]  PCP:  [name], MD  Cardiologist:  Nona Dell, MD Electrophysiologist:  None   Chief Complaint  Patient presents with  . Cardiac follow-up    History of Present Illness: Denise Hood is a 44 y.o. female former patient of Dr. Purvis Sheffield, last seen in the office in April by Ms. Strader PA-C, presenting today for a follow-up visit.  This is our first meeting, I reviewed extensive records and updated the chart.  She is here today with her mother.  Today we had a long discussion regarding her previous hospital stay, my review of records and medical therapy since that time, also her symptoms and recent follow-up of heart rate at home by apple watch.  She does not feel any sense of palpitations, states that in terms of stamina she is back to baseline in most respects.  She does not describe any chest pain, has not had any syncope.  She had a recent follow-up echocardiogram in late June that showed a substantial improvement in LVEF to the range of 50 to 55% from previously 15 to 20%.  Also normal RV contraction, trivial mitral regurgitation.  I reviewed this with her as well.  CHA2DS2-VASc score is 2.  She was not anticoagulated at initial diagnosis of atrial flutter in the setting of anemia with hemoglobin down to 8.4 during her hospitalization.  Hemoglobin was 11.3 as of follow-up lab work in April.  She states that she has a history of anemia, no obvious bleeding however, has not had follow-up lab work since April.  She remains on iron supplements.  Cardiac monitor from May showed brief bursts of SVT, the longest of which was 8 beats, no definite atrial fibrillation or flutter.  I reviewed this with her as well.  She states that she has noticed her heart rate getting into the 40s by her apple watch, no reported atrial fibrillation alerts, importantly also no sustained tachycardia  events.  I reviewed her medications which are outlined below.  She is not using Lasix with potassium supplements on a regular basis, only as needed which is reasonable at this time.  Past Medical History:  Diagnosis Date  . Anemia   . Atrial flutter [facility])    Documented March 2021  . Bacterial meningitis   . 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 Abscess surgery    . TUBAL LIGATION      Current Outpatient Medications  Medication Sig Dispense Refill  . acetaminophen (TYLENOL) 325 MG tablet Take 2 tablets (650 mg total) by mouth every 6 (six) hours as needed for mild pain, fever or headache (or Fever >/= 101). 12 tablet 0  . amphetamine-dextroamphetamine (ADDERALL) 30 MG tablet Take 30 mg by mouth 2 (two) times daily.    . carvedilol (COREG) 3.125 MG tablet Take 3.125 mg by mouth 2 (two) times daily with a meal.    . diazepam (VALIUM) 5 MG tablet Take 5 mg by mouth every 12 (twelve) hours as needed (occipital neuralgia).    . ferrous sulfate 325 (65 FE) MG tablet Take 1 tablet (325 mg total) by mouth 2 (two) times daily with a meal. 60 tablet 3  . furosemide (LASIX) 40 MG tablet Take 1 tablet (40 mg total) by mouth daily. 30 tablet 3  . potassium chloride SA (KLOR-CON)  20 MEQ tablet Take 1 tablet (20 mEq total) by mouth daily. 30 tablet 3  . sacubitril-valsartan (ENTRESTO) 24-26 MG Take 1 tablet by mouth 2 (two) times daily. 28 tablet 0   No current facility-administered medications for this visit.   Allergies:  Dilaudid [hydromorphone] and Fentanyl   ROS:   No sense of palpitations or syncope.  Physical Exam: VS:  BP 118/82   Pulse 83   Ht 5' (1.524 m)   Wt 200 lb (90.7 kg)   SpO2 98%   BMI 39.06 kg/m , BMI Body mass index is 39.06 kg/m.  Wt Readings from Last 3 Encounters:  02/18/20 200 lb (90.7 kg)  12/09/19 189 lb (85.7 kg)  11/23/19 193 lb (87.5 kg)    Entire  visit was spent in discussion today and in review of records and plan.  ECG:  An ECG dated 11/03/2019 was personally reviewed today and demonstrated:  Probable typical atrial flutter with 2:1 block.  Recent Labwork: 11/03/2019: Magnesium 1.8; TSH 3.334 11/24/2019: ALT 14; AST 18; B Natriuretic Peptide 75.0; BUN 15; Creatinine, Ser 0.73; Potassium 3.5; Sodium 134 12/08/2019: Hemoglobin 11.3; Platelets 405   Other Studies Reviewed Today:  Echocardiogram 02/09/2020: 1. EF, MR, TR significantly improved compared to echo done 11/04/19.  2. Left ventricular ejection fraction, by estimation, is 50 to 55%. The  left ventricle has low normal function. The left ventricle has no regional  wall motion abnormalities. The left ventricular internal cavity size was  mildly dilated. Left ventricular  diastolic parameters were normal.  3. Right ventricular systolic function is normal. The right ventricular  size is normal.  4. The mitral valve is normal in structure. Trivial mitral valve  regurgitation. No evidence of mitral stenosis.  5. The aortic valve is tricuspid. Aortic valve regurgitation is not  visualized. Mild aortic valve sclerosis is present, with no evidence of  aortic valve stenosis.  6. The inferior vena cava is normal in size with greater than 50%  respiratory variability, suggesting right atrial pressure of 3 mmHg.   Assessment and Plan:  1.  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  Fortunately, LVEF has improved to low normal range at 50 to 55% by recent follow-up echocardiogram, also normal RV contraction.  She is on Coreg and Sherryll Burger which will continue at current dose.  Lasix with potassium supplement can be used on an as-needed basis at this point.  We will obtain a follow-up BMET.  2.  Typical atrial flutter as described above, no obvious prolonged breakthrough events, cardiac monitor did  not demonstrate any sustained arrhythmias.  CHA2DS2-VASc score is 2 although she has not been anticoagulated with initial documentation of anemia during prior hospitalization.  She does not describe any obvious bleeding events, states that she has been told that she was anemic in the past.  She has been on iron supplements.  We will obtain a follow-up CBC.  If her hemoglobin has continued to improve it may be worth considering initiation of DOAC to reduce stroke risk with careful follow-up of CBC.  I also wonder whether she would be a reasonable candidate for atrial flutter ablation at some point.  It does not sound like she had any substantial sense of palpitations at her initial diagnosis and would be at risk for recurring cardiomyopathy with recurring, persistent arrhythmia.  Medication Adjustments/Labs and Tests Ordered: Current medicines are reviewed at length with the patient today.  Concerns regarding medicines are  outlined above.   Tests Ordered: Orders Placed This Encounter  Procedures  . CBC  . Basic Metabolic Panel (BMET)    Medication Changes: Meds ordered this encounter  Medications  . furosemide (LASIX) 40 MG tablet    Sig: Take 1 tablet (40 mg total) by mouth daily.    Dispense:  30 tablet    Refill:  3  . potassium chloride SA (KLOR-CON) 20 MEQ tablet    Sig: Take 1 tablet (20 mEq total) by mouth daily.    Dispense:  30 tablet    Refill:  3    Disposition:  Follow up 4 to 6 weeks in the [place] office.  Signed, Jonelle Sidle, MD, [facility] 02/18/2020 2:16 PM    [facility] at [facility] [address] [place], [place], [place] [address] Phone: [phone]; Fax: [phone]

## 2020-02-18 NOTE — Patient Instructions (Addendum)
Medication Instructions:   Your physician recommends that you continue on your current medications as directed. Please refer to the Current Medication list given to you today.  Labwork:  Your physician recommends that you return for lab work in: TODAY to check your BMET and CBC. This may be done at [facility] or [facility] ([address] [place]) Monday-Friday from 8:00 am - 4:00 pm. No appointment is needed.  Testing/Procedures:  NONE  Follow-Up:  Your physician recommends that you schedule a follow-up appointment in: 4-6 weeks ([place] office)  Any Other Special Instructions Will Be Listed Below (If Applicable).  If you need a refill on your cardiac medications before your next appointment, please call your pharmacy.

## 2020-02-19 ENCOUNTER — Ambulatory Visit: Payer: Self-pay | Admitting: Cardiovascular Disease

## 2020-02-19 ENCOUNTER — Other Ambulatory Visit (HOSPITAL_COMMUNITY)
Admission: RE | Admit: 2020-02-19 | Discharge: 2020-02-19 | Disposition: A | Discharge status: Home / Self Care | Payer: BC Managed Care – PPO | Source: Ambulatory Visit | Attending: Cardiology | Admitting: Cardiology

## 2020-02-19 ENCOUNTER — Other Ambulatory Visit: Payer: Self-pay

## 2020-02-19 DIAGNOSIS — I428 Other cardiomyopathies: Secondary | ICD-10-CM | POA: Diagnosis not present

## 2020-02-19 DIAGNOSIS — I4892 Unspecified atrial flutter: Secondary | ICD-10-CM | POA: Insufficient documentation

## 2020-02-19 DIAGNOSIS — Z79899 Other long term (current) drug therapy: Secondary | ICD-10-CM | POA: Insufficient documentation

## 2020-02-19 DIAGNOSIS — I5022 Chronic systolic (congestive) heart failure: Secondary | ICD-10-CM | POA: Diagnosis not present

## 2020-02-19 LAB — CBC
HCT: 34.9 % — ABNORMAL LOW (ref 36.0–46.0)
Hemoglobin: 10.8 g/dL — ABNORMAL LOW (ref 12.0–15.0)
MCH: 26.9 pg (ref 26.0–34.0)
MCHC: 30.9 g/dL (ref 30.0–36.0)
MCV: 87 fL (ref 80.0–100.0)
Platelets: 493 10*3/uL — ABNORMAL HIGH (ref 150–400)
RBC: 4.01 MIL/uL (ref 3.87–5.11)
RDW: 14.2 % (ref 11.5–15.5)
WBC: 7.1 10*3/uL (ref 4.0–10.5)
nRBC: 0 % (ref 0.0–0.2)

## 2020-02-19 LAB — BASIC METABOLIC PANEL
Anion gap: 8 (ref 5–15)
BUN: 16 mg/dL (ref 6–20)
CO2: 26 mmol/L (ref 22–32)
Calcium: 8.7 mg/dL — ABNORMAL LOW (ref 8.9–10.3)
Chloride: 103 mmol/L (ref 98–111)
Creatinine, Ser: 0.75 mg/dL (ref 0.44–1.00)
GFR calc Af Amer: >60 mL/min (ref >60)
GFR calc non Af Amer: >60 mL/min (ref >60)
Glucose, Bld: 139 mg/dL — ABNORMAL HIGH (ref 70–99)
Potassium: 4.1 mmol/L (ref 3.5–5.1)
Sodium: 137 mmol/L (ref 135–145)

## 2020-02-23 ENCOUNTER — Telehealth: Payer: Self-pay | Admitting: *Deleted

## 2020-02-23 NOTE — Telephone Encounter (Signed)
Patient informed. Copy sent to PCP °

## 2020-02-23 NOTE — Telephone Encounter (Signed)
-----   Message from Jonelle Sidle, MD sent at 02/19/2020  4:19 PM EDT ----- Results reviewed.  Still anemic with hemoglobin 10.8.  MCV is normal although previously low suggesting iron deficiency.  Not comfortable starting her on anticoagulation unless this is further evaluated.  Please see if she would be willing to follow-up with her PCP regarding anemia work-up, she may need consultation with either gastroenterology or hematology.

## 2020-02-23 NOTE — Telephone Encounter (Signed)
-----   Message from Jonelle Sidle, MD sent at 02/19/2020  4:28 PM EDT ----- Results reviewed.  Renal function is stable and potassium level normal.

## 2020-03-21 NOTE — Progress Notes (Signed)
Cardiology Office Note  Date: 03/22/2020   ID: Denise Hood, DOB 1975/09/25, MRN [id number]  PCP:  Celene Squibb, MD  Cardiologist:  Rozann Lesches, MD Electrophysiologist:  None   Chief Complaint  Patient presents with  . Cardiac follow-up    History of Present Illness: Denise Hood is a 44 y.o. female that I met recently in July.  She presents for a routine visit.  She does not report any sustained tachycardia or regular palpitations, has stable NYHA class II dyspnea with typical activities.  She has an apple watch and keeps an eye on heart rate variation, has not gotten any atrial fibrillation alarms.  Interval lab work is outlined below.  Evidence of iron deficiency anemia, hemoglobin 10.8, but MCV normal (previously low).  We did not initiate anticoagulation.  She does not report any obvious stool changes, no melena or hematochezia.  She states that she has been told that she is anemic for many years, not entirely clear to me that this has been worked up as far as etiology.  She does report history of heavy menses.  I reviewed her medications which are outlined below.  Cardiac regimen is stable.  Past Medical History:  Diagnosis Date  . Anemia   . Atrial flutter [facility])    Documented March 2021  . Bacterial meningitis   . 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 Abscess surgery    . TUBAL LIGATION      Current Outpatient Medications  Medication Sig Dispense Refill  . acetaminophen (TYLENOL) 325 MG tablet Take 2 tablets (650 mg total) by mouth every 6 (six) hours as needed for mild pain, fever or headache (or Fever >/= 101). 12 tablet 0  . amphetamine-dextroamphetamine (ADDERALL) 30 MG tablet Take 30 mg by mouth 2 (two) times daily.    . carvedilol (COREG) 3.125 MG tablet Take 3.125 mg by mouth 2 (two) times daily with a meal.    . diazepam (VALIUM) 5 MG tablet  Take 5 mg by mouth every 12 (twelve) hours as needed (occipital neuralgia).    . ferrous sulfate 325 (65 FE) MG tablet Take 1 tablet (325 mg total) by mouth 2 (two) times daily with a meal. 60 tablet 3  . furosemide (LASIX) 40 MG tablet Take 1 tablet (40 mg total) by mouth daily. (Patient taking differently: Take 40 mg by mouth daily as needed. ) 30 tablet 3  . potassium chloride SA (KLOR-CON) 20 MEQ tablet Take 1 tablet (20 mEq total) by mouth daily. (Patient taking differently: Take 20 mEq by mouth daily as needed. ) 30 tablet 3  . sacubitril-valsartan (ENTRESTO) 24-26 MG Take 1 tablet by mouth 2 (two) times daily. 28 tablet 0   No current facility-administered medications for this visit.   Allergies:  Dilaudid [hydromorphone] and Fentanyl   ROS:   No syncope.  Physical Exam: VS:  BP 124/82   Pulse 77   Ht 5' (1.524 m)   Wt 201 lb 12.8 oz (91.5 kg)   SpO2 100%   BMI 39.41 kg/m , BMI Body mass index is 39.41 kg/m.  Wt Readings from Last 3 Encounters:  03/22/20 201 lb 12.8 oz (91.5 kg)  02/18/20 200 lb (90.7 kg)  12/09/19 189 lb (85.7 kg)    General: Patient appears comfortable at rest. HEENT: Conjunctiva and lids normal, wearing a mask. Neck: Supple, no elevated JVP or carotid  bruits. Lungs: Clear to auscultation, nonlabored breathing at rest. Cardiac: Regular rate and rhythm, no S3 or significant systolic murmur, no pericardial rub. Extremities: No pitting edema, distal pulses 2+.  ECG:  An ECG dated 11/03/2019 was personally reviewed today and demonstrated:  Probable typical atrial flutter with 2:1 block.  Recent Labwork: 11/03/2019: Magnesium 1.8; TSH 3.334 11/24/2019: ALT 14; AST 18; B Natriuretic Peptide 75.0 02/19/2020: BUN 16; Creatinine, Ser 0.75; Hemoglobin 10.8; Platelets 493; Potassium 4.1; Sodium 137   Other Studies Reviewed Today:  Echocardiogram 02/09/2020: 1. EF, MR, TR significantly improved compared to echo done 11/04/19.  2. Left ventricular ejection  fraction, by estimation, is 50 to 55%. The  left ventricle has low normal function. The left ventricle has no regional  wall motion abnormalities. The left ventricular internal cavity size was  mildly dilated. Left ventricular  diastolic parameters were normal.  3. Right ventricular systolic function is normal. The right ventricular  size is normal.  4. The mitral valve is normal in structure. Trivial mitral valve  regurgitation. No evidence of mitral stenosis.  5. The aortic valve is tricuspid. Aortic valve regurgitation is not  visualized. Mild aortic valve sclerosis is present, with no evidence of  aortic valve stenosis.  6. The inferior vena cava is normal in size with greater than 50%  respiratory variability, suggesting right atrial pressure of 3 mmHg.   Assessment and Plan:  1.  Presumed nonischemic cardiomyopathy, tachycardia-mediated in the setting of rapid atrial flutter as documented previously.  LVEF 50 to 55% range, representing significant improvement by her most recent echocardiogram in June.  She is symptomatically stable with NYHA class II symptoms and continues on Coreg, Entresto, Lasix, and potassium supplements.  Renal function and potassium normal by recent lab work.  2.  Typical atrial flutter as documented in the last note, CHA2DS2-VASc score is 2.  Anticoagulation has not been pursued given consistent documentation of anemia and a history of iron deficiency anemia.  It does not sound like this has been completely evaluated in terms of etiology.  She does not describe any hematochezia or melena, does report a history of heavy menses.  She states that she has been told about her anemia for "years."  We will refer her to hematology for a basic work-up, if she needs further GI evaluation that can be arranged subsequently.  Main question is whether there is a source of bleeding that needs to be better understood prior to making a decision about longer term anticoagulation.   I talked with her today also about the possibility of EP consultation regarding atrial flutter ablation.  For now, she voiced comfort with observation.  Medication Adjustments/Labs and Tests Ordered: Current medicines are reviewed at length with the patient today.  Concerns regarding medicines are outlined above.   Tests Ordered: Orders Placed This Encounter  Procedures  . Ambulatory referral to Hematology    Medication Changes: No orders of the defined types were placed in this encounter.   Disposition:  Follow up 3 months in the [place] office.  Signed, Satira Sark, MD, [facility] 03/22/2020 1:33 PM    [place] [facility] [address], [place], [place] [address] Phone: [phone]; Fax: [phone]

## 2020-03-22 ENCOUNTER — Encounter: Payer: Self-pay | Admitting: Cardiology

## 2020-03-22 ENCOUNTER — Ambulatory Visit (INDEPENDENT_AMBULATORY_CARE_PROVIDER_SITE_OTHER): Payer: BC Managed Care – PPO | Admitting: Cardiology

## 2020-03-22 ENCOUNTER — Other Ambulatory Visit: Payer: Self-pay

## 2020-03-22 VITALS — BP 124/82 | HR 77 | Ht 60.0 in | Wt 201.8 lb

## 2020-03-22 DIAGNOSIS — I483 Typical atrial flutter: Secondary | ICD-10-CM

## 2020-03-22 DIAGNOSIS — D509 Iron deficiency anemia, unspecified: Secondary | ICD-10-CM | POA: Diagnosis not present

## 2020-03-22 DIAGNOSIS — I428 Other cardiomyopathies: Secondary | ICD-10-CM | POA: Diagnosis not present

## 2020-03-22 NOTE — Patient Instructions (Signed)
Medication Instructions:  Your physician recommends that you continue on your current medications as directed. Please refer to the Current Medication list given to you today.  *If you need a refill on your cardiac medications before your next appointment, please call your pharmacy*   Lab Work: None today If you have labs (blood work) drawn today and your tests are completely normal, you will receive your results only by: Marland Kitchen MyChart Message (if you have MyChart) OR . A paper copy in the mail If you have any lab test that is abnormal or we need to change your treatment, we will call you to review the results.   Testing/Procedures: None today   Follow-Up: At [facility], you and your health needs are our priority.  As part of our continuing mission to provide you with exceptional heart care, we have created designated Provider Care Teams.  These Care Teams include your primary Cardiologist (physician) and Advanced Practice Providers (APPs -  Physician Assistants and Nurse Practitioners) who all work together to provide you with the care you need, when you need it.  We recommend signing up for the patient portal called "MyChart".  Sign up information is provided on this After Visit Summary.  MyChart is used to connect with patients for Virtual Visits (Telemedicine).  Patients are able to view lab/test results, encounter notes, upcoming appointments, etc.  Non-urgent messages can be sent to your provider as well.   To learn more about what you can do with MyChart, go to [email or web address].    Your next appointment:   3 month(s)  The format for your next appointment:   In Person  Provider:   Nona Dell, MD or Randall An, PA-C   Other Instructions We have placed a referral to hematology. They will call you to schedule an apt       Thank you for choosing [facility] !

## 2020-04-01 ENCOUNTER — Ambulatory Visit (HOSPITAL_COMMUNITY): Payer: BC Managed Care – PPO | Admitting: Hematology

## 2020-07-20 NOTE — Progress Notes (Signed)
Cardiology Office Note  Date: 07/21/2020   ID: Denise Hood, DOB 13-Nov-1975, MRN [id number]  PCP:  [name], MD  Cardiologist:  Nona Dell, MD Electrophysiologist:  None   Chief Complaint  Patient presents with   Cardiac follow-up    History of Present Illness: Denise Hood is a 44 y.o. female last seen in August.  She presents for a follow-up visit.  Reports no progressive shortness of breath with typical activities, no sense of palpitations or alerts about atrial fibrillation from her Apple watch.  We did discuss referral to hematology to evaluate iron deficiency anemia after the last visit, she ultimately did not pursue this.  She does not describe any obvious bleeding or changes in stools.  We discussed getting follow-up lab work in comparison to July.  I reviewed her medications, she continues on Coreg, Lasix with potassium supplement, and Entresto.  Her last echocardiogram in June revealed LVEF 50 to 55% range.   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 12 tablet 0   amphetamine-dextroamphetamine (ADDERALL) 30 MG tablet Take 30 mg by mouth 2 (two) times daily.     carvedilol (COREG) 3.125 MG tablet Take 3.125 mg by mouth 2 (two) times daily with a meal.     diazepam (VALIUM) 5 MG tablet Take 5 mg by mouth every 12 (twelve) hours as needed (occipital neuralgia).     ferrous sulfate 325 (65 FE) MG tablet Take 1 tablet (325 mg total) by mouth 2 (two) times daily  with a meal. 60 tablet 3   furosemide (LASIX) 40 MG tablet Take 1 tablet (40 mg total) by mouth daily. (Patient taking differently: Take 40 mg by mouth daily as needed. ) 30 tablet 3   potassium chloride SA (KLOR-CON) 20 MEQ tablet Take 1 tablet (20 mEq total) by mouth daily. (Patient taking differently: Take 20 mEq by mouth daily as needed. ) 30 tablet 3   sacubitril-valsartan (ENTRESTO) 24-26 MG Take 1 tablet by mouth 2 (two) times daily. 28 tablet 0   No current facility-administered medications for this visit.   Allergies:  Dilaudid [hydromorphone] and Fentanyl   ROS: No syncope.  Physical Exam: VS:  BP (!) 140/96    Pulse 91    Ht 5' (1.524 m)    Wt 210 lb (95.3 kg)    SpO2 98%    BMI 41.01 kg/m , BMI Body mass index is 41.01 kg/m.  Wt Readings from Last 3 Encounters:  07/21/20 210 lb (95.3 kg)  03/22/20 201 lb 12.8 oz (91.5 kg)  02/18/20 200 lb (90.7 kg)    General: Patient appears comfortable at rest. HEENT: Conjunctiva and lids normal, wearing a mask. Neck: No elevated JVP. Lungs: Clear to auscultation, nonlabored breathing at rest. Cardiac: Regular rate and rhythm, no S3 or significant systolic murmur, no pericardial rub. Extremities: No pitting edema.  ECG:  An ECG dated 11/03/2019 was personally reviewed today and demonstrated:  Probable typical atrial flutter with 2:1 block.  Recent Labwork: 11/03/2019: Magnesium 1.8; TSH 3.334 11/24/2019: ALT 14; AST 18; B Natriuretic Peptide 75.0 02/19/2020: BUN 16; Creatinine, Ser 0.75; Hemoglobin 10.8; Platelets 493; Potassium 4.1; Sodium 137   Other Studies Reviewed Today:  Echocardiogram 02/09/2020: 1. EF, MR, TR significantly improved compared to echo done 11/04/19.  2. Left ventricular ejection fraction, by estimation, is 50 to 55%. The  left ventricle has low normal function. The left ventricle has no regional  wall motion abnormalities. The left ventricular internal cavity size was  mildly dilated. Left ventricular    diastolic parameters were normal.  3. Right ventricular systolic function is normal. The right ventricular  size is normal.  4. The mitral valve is normal in structure. Trivial mitral valve  regurgitation. No evidence of mitral stenosis.  5. The aortic valve is tricuspid. Aortic valve regurgitation is not  visualized. Mild aortic valve sclerosis is present, with no evidence of  aortic valve stenosis.  6. The inferior vena cava is normal in size with greater than 50%  respiratory variability, suggesting right atrial pressure of 3 mmHg.   Assessment and Plan:  1.  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  She is currently on Coreg, Lasix with potassium supplement, and Entresto.  Follow-up BMET.  2.  Typical atrial flutter with CHA2DS2-VASc score of 1 at this point considering improvement in LVEF.  We have been hesitant to initiate anticoagulation given iron deficiency anemia at baseline without definite etiology.  We did discuss referral to hematology for evaluation, would keep follow-up with PCP in the interim, she does not describe any obvious bleeding problems.  Check CBC.  Medication Adjustments/Labs and Tests Ordered: Current medicines are reviewed at length with the patient today.  Concerns regarding medicines are outlined above.   Tests Ordered: Orders Placed This Encounter  Procedures   CBC   Basic Metabolic Panel (BMET)   ECHOCARDIOGRAM COMPLETE    Medication Changes: No orders of the defined types were placed in this encounter.   Disposition:  Follow up 4 months in the [place] office with follow-up echocardiogram.  Signed, Jonelle Sidle, MD, [facility] 07/21/2020 2:50 PM    [facility] at [facility] [address], [place], [place] [address] Phone: [phone]; Fax: [phone]

## 2020-07-21 ENCOUNTER — Ambulatory Visit (INDEPENDENT_AMBULATORY_CARE_PROVIDER_SITE_OTHER): Payer: BC Managed Care – PPO | Admitting: Cardiology

## 2020-07-21 ENCOUNTER — Other Ambulatory Visit: Payer: Self-pay

## 2020-07-21 ENCOUNTER — Encounter: Payer: Self-pay | Admitting: Cardiology

## 2020-07-21 VITALS — BP 140/96 | HR 91 | Ht 60.0 in | Wt 210.0 lb

## 2020-07-21 DIAGNOSIS — I483 Typical atrial flutter: Secondary | ICD-10-CM

## 2020-07-21 DIAGNOSIS — I428 Other cardiomyopathies: Secondary | ICD-10-CM | POA: Diagnosis not present

## 2020-07-21 NOTE — Patient Instructions (Signed)
Medication Instructions:  Your physician recommends that you continue on your current medications as directed. Please refer to the Current Medication list given to you today.  *If you need a refill on your cardiac medications before your next appointment, please call your pharmacy*   Lab Work: Cbc, bmet   If you have labs (blood work) drawn today and your tests are completely normal, you will receive your results only by: Marland Kitchen MyChart Message (if you have MyChart) OR . A paper copy in the mail If you have any lab test that is abnormal or we need to change your treatment, we will call you to review the results.   Testing/Procedures: Your physician has requested that you have an echocardiogram (in 4 months) . Echocardiography is a painless test that uses sound waves to create images of your heart. It provides your doctor with information about the size and shape of your heart and how well your heart's chambers and valves are working. This procedure takes approximately one hour. There are no restrictions for this procedure.     Follow-Up: At [facility], you and your health needs are our priority.  As part of our continuing mission to provide you with exceptional heart care, we have created designated Provider Care Teams.  These Care Teams include your primary Cardiologist (physician) and Advanced Practice Providers (APPs -  Physician Assistants and Nurse Practitioners) who all work together to provide you with the care you need, when you need it.  We recommend signing up for the patient portal called "MyChart".  Sign up information is provided on this After Visit Summary.  MyChart is used to connect with patients for Virtual Visits (Telemedicine).  Patients are able to view lab/test results, encounter notes, upcoming appointments, etc.  Non-urgent messages can be sent to your provider as well.   To learn more about what you can do with MyChart, go to [email or web address].    Your next  appointment:   4 month(s)  The format for your next appointment:   In Person  Provider:   Nona Dell, MD   Other Instructions None      Thank you for choosing [facility] !

## 2020-08-01 ENCOUNTER — Other Ambulatory Visit (HOSPITAL_COMMUNITY)
Admission: RE | Admit: 2020-08-01 | Discharge: 2020-08-01 | Disposition: A | Discharge status: Home / Self Care | Payer: BC Managed Care – PPO | Source: Ambulatory Visit | Attending: Cardiology | Admitting: Cardiology

## 2020-08-01 DIAGNOSIS — I483 Typical atrial flutter: Secondary | ICD-10-CM | POA: Insufficient documentation

## 2020-08-01 DIAGNOSIS — I428 Other cardiomyopathies: Secondary | ICD-10-CM | POA: Insufficient documentation

## 2020-11-23 ENCOUNTER — Ambulatory Visit (INDEPENDENT_AMBULATORY_CARE_PROVIDER_SITE_OTHER): Payer: BC Managed Care – PPO

## 2020-11-23 DIAGNOSIS — I428 Other cardiomyopathies: Secondary | ICD-10-CM | POA: Diagnosis not present

## 2020-11-23 LAB — ECHOCARDIOGRAM COMPLETE
Area-P 1/2: 3.23 cm2
Calc EF: 60 %
S' Lateral: 2.52 cm
Single Plane A2C EF: 59.1 %
Single Plane A4C EF: 60.9 %

## 2020-11-25 ENCOUNTER — Telehealth: Payer: Self-pay | Admitting: Cardiology

## 2020-11-25 ENCOUNTER — Other Ambulatory Visit: Payer: Self-pay

## 2020-11-25 ENCOUNTER — Ambulatory Visit (INDEPENDENT_AMBULATORY_CARE_PROVIDER_SITE_OTHER): Payer: BC Managed Care – PPO | Admitting: Cardiology

## 2020-11-25 ENCOUNTER — Encounter: Payer: Self-pay | Admitting: Cardiology

## 2020-11-25 VITALS — BP 144/88 | HR 79 | Ht 60.0 in | Wt 207.0 lb

## 2020-11-25 DIAGNOSIS — I483 Typical atrial flutter: Secondary | ICD-10-CM | POA: Diagnosis not present

## 2020-11-25 DIAGNOSIS — E782 Mixed hyperlipidemia: Secondary | ICD-10-CM | POA: Diagnosis not present

## 2020-11-25 DIAGNOSIS — I428 Other cardiomyopathies: Secondary | ICD-10-CM

## 2020-11-25 NOTE — Progress Notes (Signed)
Cardiology Office Note  Date: 11/25/2020   ID: Denise Hood, DOB 06/26/1976, MRN [id number]  PCP:  [name], MD  Cardiologist:  Nona Dell, MD Electrophysiologist:  None   Chief Complaint  Patient presents with  . Cardiac follow-up    History of Present Illness: Denise Hood is a 45 y.o. female last seen in December 2021.  She is here for a routine follow-up visit.  She reports NYHA class I-II dyspnea, was having intermittent palpitations due to PACs, but this is settled down since cutting back on caffeine.  I reviewed her recent lab work from March as noted below.  Hemoglobin was 13.3 at that time.  She does not report any obvious bleeding problems.  She had a recent echocardiogram done showing normalization of LVEF in the range of 55 to 60%, also normal RV contraction.  I discussed this with her today.  I went over her medications.  She takes Lasix with potassium supplement perhaps once a week or less.  I personally reviewed her ECG today which shows sinus rhythm with nonspecific T wave changes.  Past Medical History:  Diagnosis Date  . Anemia   . Atrial flutter [facility])    Documented March 2021  . Bacterial meningitis   . 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 Abscess surgery    . TUBAL LIGATION      Current Outpatient Medications  Medication Sig Dispense Refill  . acetaminophen (TYLENOL) 325 MG tablet Take 2 tablets (650 mg total) by mouth every 6 (six) hours as needed for mild pain, fever or headache (or Fever >/= 101). 12 tablet 0  . amphetamine-dextroamphetamine (ADDERALL) 30 MG tablet Take 30 mg by mouth 2 (two) times daily.    . carvedilol (COREG) 3.125 MG tablet Take 3.125 mg by mouth 2 (two) times daily with a meal.    . diazepam (VALIUM) 5 MG tablet Take 5 mg by mouth every 12 (twelve) hours as needed (occipital neuralgia).    . ferrous  sulfate 325 (65 FE) MG tablet Take 1 tablet (325 mg total) by mouth 2 (two) times daily with a meal. 60 tablet 3  . furosemide (LASIX) 40 MG tablet Take 1 tablet (40 mg total) by mouth daily. (Patient taking differently: Take 40 mg by mouth daily as needed.) 30 tablet 3  . potassium chloride SA (KLOR-CON) 20 MEQ tablet Take 1 tablet (20 mEq total) by mouth daily. (Patient taking differently: Take 20 mEq by mouth daily as needed.) 30 tablet 3  . rosuvastatin (CRESTOR) 5 MG tablet Take 5 mg by mouth daily.    . sacubitril-valsartan (ENTRESTO) 24-26 MG Take 1 tablet by mouth 2 (two) times daily. 28 tablet 0   No current facility-administered medications for this visit.   Allergies:  Dilaudid [hydromorphone] and Fentanyl   ROS: No syncope.  Physical Exam: VS:  BP (!) 144/88   Pulse 79   Ht 5' (1.524 m)   Wt 207 lb (93.9 kg)   SpO2 98%   BMI 40.43 kg/m , BMI Body mass index is 40.43 kg/m.  Wt Readings from Last 3 Encounters:  11/25/20 207 lb (93.9 kg)  07/21/20 210 lb (95.3 kg)  03/22/20 201 lb 12.8 oz (91.5 kg)    General: Patient appears comfortable at rest. HEENT: Conjunctiva and lids normal, wearing a mask. Neck: Supple, no elevated JVP or carotid bruits, no thyromegaly. Lungs: Clear  to auscultation, nonlabored breathing at rest. Cardiac: Regular rate and rhythm, no S3 or significant systolic murmur, no pericardial rub. Extremities: No pitting edema.  ECG:  An ECG dated 11/03/2019 was personally reviewed today and demonstrated:  Probable typical atrial flutter with 2-1 block.  Recent Labwork: 02/19/2020: BUN 16; Creatinine, Ser 0.75; Hemoglobin 10.8; Platelets 493; Potassium 4.1; Sodium 137  March 2022: Hemoglobin 13.4, MCV 86, platelets 538, BUN 11, creatinine 0.75, potassium 4.4, AST 17, ALT 10, cholesterol 242, triglycerides 177, HDL 44, LDL 165, hemoglobin A1c 6.7%  Other Studies Reviewed Today:  Echocardiogram 11/23/2020: 1. Left ventricular ejection fraction, by  estimation, is 55 to 60%. The  left ventricle has normal function. The left ventricle has no regional  wall motion abnormalities. There is mild left ventricular hypertrophy.  Left ventricular diastolic parameters  were normal. Normal global longitudinal strain of -17.5%.  2. Right ventricular systolic function is normal. The right ventricular  size is normal. There is normal pulmonary artery systolic pressure. The  estimated right ventricular systolic pressure is 20.5 mmHg.  3. The mitral valve is grossly normal. Trivial mitral valve  regurgitation.  4. The aortic valve is grossly normal. Aortic valve regurgitation is not  visualized.  5. The inferior vena cava is normal in size with greater than 50%  respiratory variability, suggesting right atrial pressure of 3 mmHg.   Assessment and Plan:  1.  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  ECG reviewed today.  Plan to continue Coreg, Entresto, and as needed use of Lasix with potassium supplement.  2.  Typical atrial flutter with CHA2DS2-VASc score of 1 at this point.  We have held off on anticoagulation in light of iron deficiency anemia.  No reported bleeding problems and hemoglobin most recently normal range.  Continue observation for now.  She is in sinus rhythm today.  3.  Mixed hyperlipidemia, recent LDL 165.  She was started on Crestor 5 mg daily by Dr. Margo Aye.  Medication Adjustments/Labs and Tests Ordered: Current medicines are reviewed at length with the patient today.  Concerns regarding medicines are outlined above.   Tests Ordered: Orders Placed This Encounter  Procedures  . EKG 12-Lead    Medication Changes: No orders of the defined types were placed in this encounter.   Disposition:  Follow up 6 months in the [place] office.  Signed, Jonelle Sidle, MD, [facility] 11/25/2020 10:10 AM    [facility] at [facility] [address] [place], [place], [place]  [address] Phone: [phone]; Fax: [phone]

## 2020-11-25 NOTE — Patient Instructions (Addendum)

## 2020-11-25 NOTE — Telephone Encounter (Signed)
MR# [id number] [employer] PAPER WORK  Received 11/25/2020 Placed on Dr. Ival Bible desk

## 2021-02-27 ENCOUNTER — Telehealth: Payer: Self-pay | Admitting: Medical

## 2021-02-27 MED ORDER — ENTRESTO 24-26 MG PO TABS: 1.0000 | ORAL_TABLET | Freq: Two times a day (BID) | ORAL | 3 refills | Status: DC

## 2021-02-27 MED ORDER — ENTRESTO 24-26 MG PO TABS: 1.0000 | ORAL_TABLET | Freq: Two times a day (BID) | ORAL | 3 refills | Status: AC

## 2021-02-27 NOTE — Addendum Note (Signed)
Addended by: Judy Pimple on: 02/27/2021 07:40 PM   Modules accepted: Orders

## 2021-02-27 NOTE — Telephone Encounter (Signed)
   Patient ran out of entresto 2 days prior. Requesting refill. Refill sent to preferred pharmacy. Appreciative of call.  Beatriz Stallion, PA-C 02/27/21; 5:47 PM

## 2021-02-27 NOTE — Addendum Note (Signed)
Addended by: Judy Pimple on: 02/27/2021 06:41 PM   Modules accepted: Orders

## 2021-03-01 ENCOUNTER — Other Ambulatory Visit (HOSPITAL_COMMUNITY): Payer: Self-pay | Admitting: Adult Health Nurse Practitioner

## 2021-03-01 DIAGNOSIS — Z1231 Encounter for screening mammogram for malignant neoplasm of breast: Secondary | ICD-10-CM

## 2021-03-13 ENCOUNTER — Ambulatory Visit (HOSPITAL_COMMUNITY): Payer: BC Managed Care – PPO

## 2021-03-16 ENCOUNTER — Ambulatory Visit (HOSPITAL_COMMUNITY): Payer: BC Managed Care – PPO

## 2021-03-16 ENCOUNTER — Encounter (HOSPITAL_COMMUNITY): Payer: Self-pay

## 2021-06-02 IMAGING — CT CT ANGIO CHEST
2 of 7 series · 19 of 46 positions shown · IV contrast (Omnipaque or Isovue)
Comparison: None.

CLINICAL DATA: Shortness of breath and tachycardia.

EXAM:
CT ANGIOGRAPHY CHEST WITH CONTRAST
TECHNIQUE: Multidetector CT imaging of the chest was performed using the
standard protocol during bolus administration of intravenous
contrast. Multiplanar CT image reconstructions and MIPs were
obtained to evaluate the vascular anatomy.
CONTRAST:  100mL OMNIPAQUE IOHEXOL 350 MG/ML SOLN

[Series 6: pe axial thins · axial · 0.64mm/px · z∈[+1237,+1481]mm · 16 of 278 slices shown]
[im 17/278  lung]
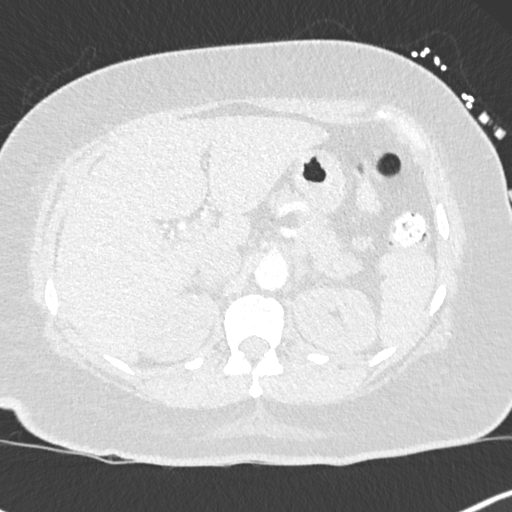
[im 33/278  soft-tissue]
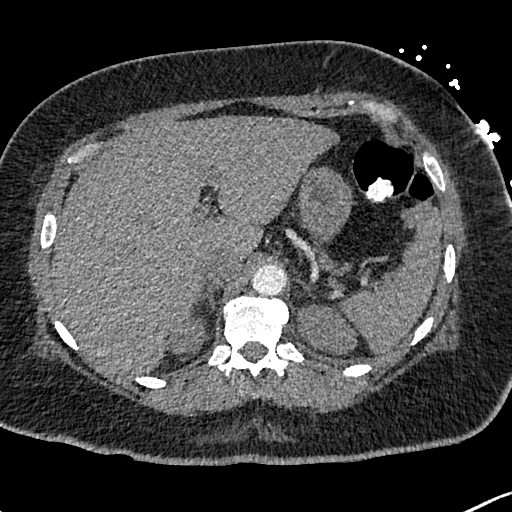
[im 49/278  lung]
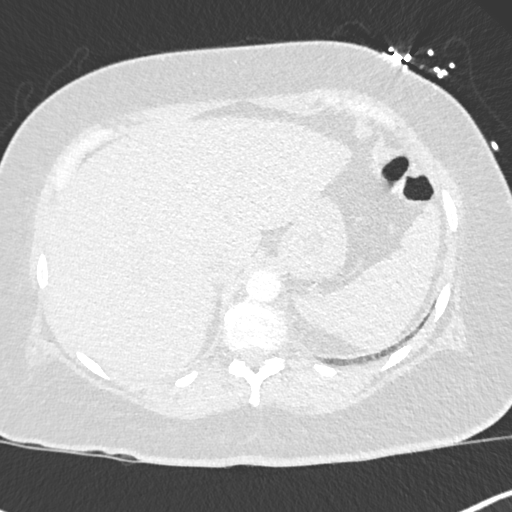
[im 66/278  soft-tissue]
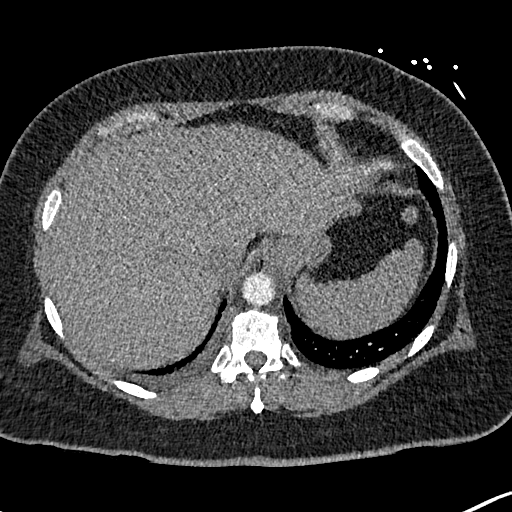
[im 82/278  lung]
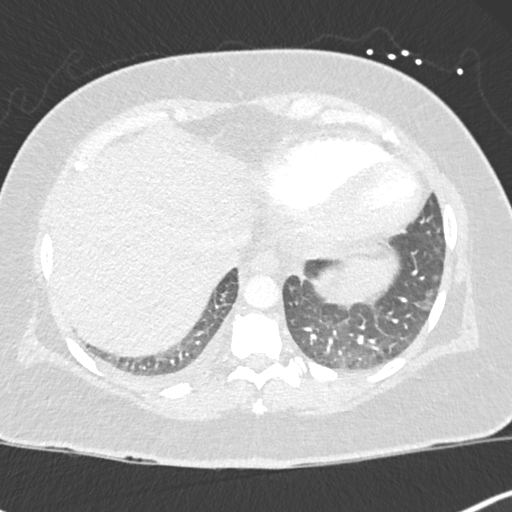
[im 98/278  soft-tissue]
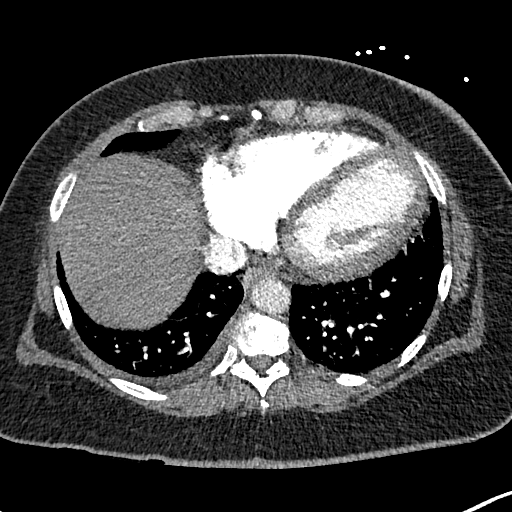
[im 115/278  lung]
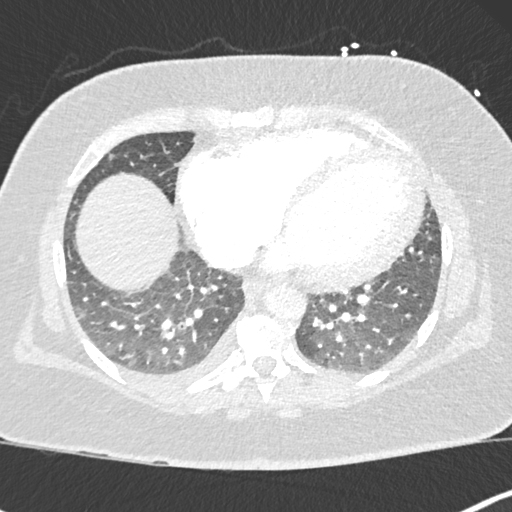
[im 131/278  soft-tissue]
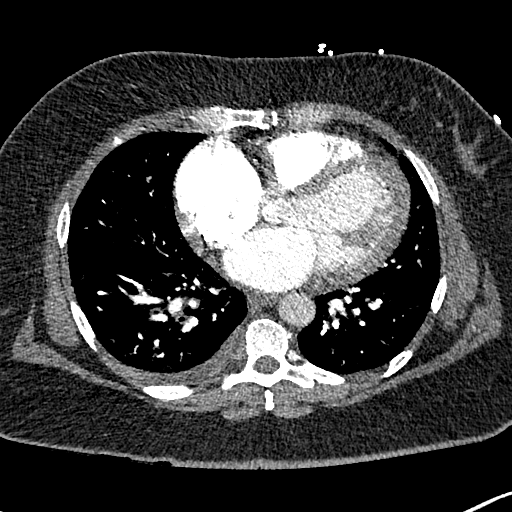
[im 147/278  lung]
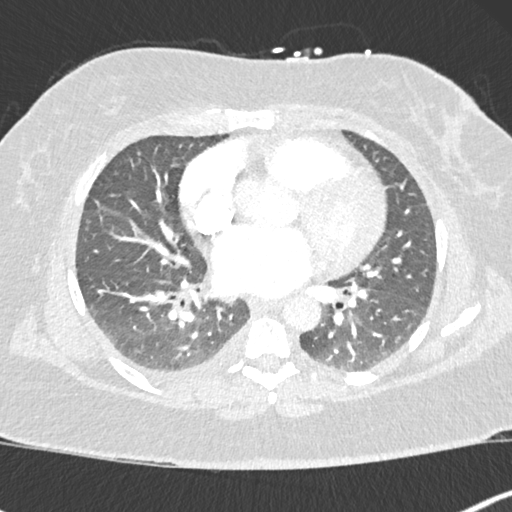
[im 163/278  soft-tissue]
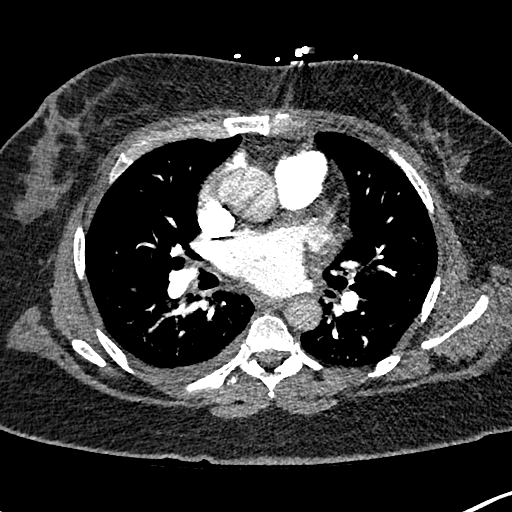
[im 180/278  lung]
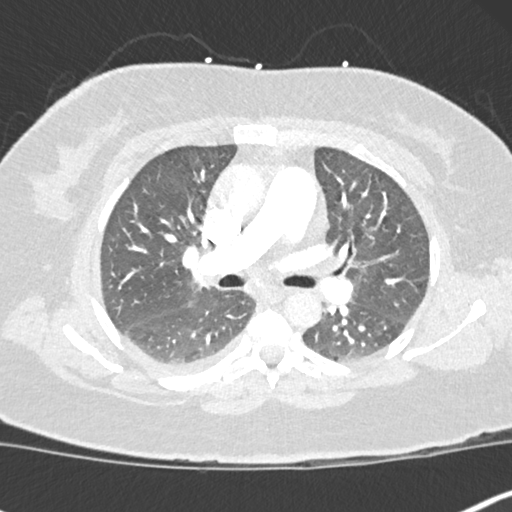
[im 196/278  soft-tissue]
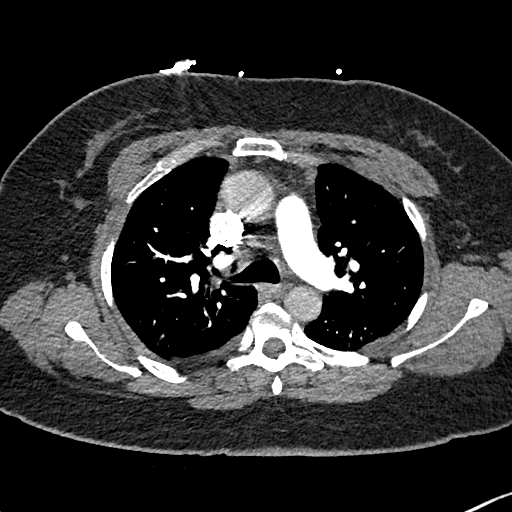
[im 212/278  lung]
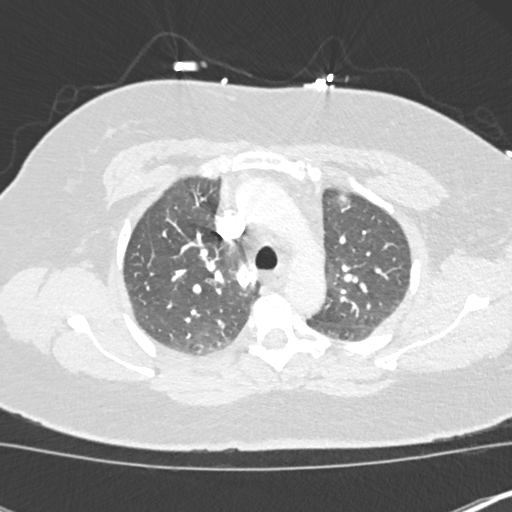
[im 229/278  soft-tissue]
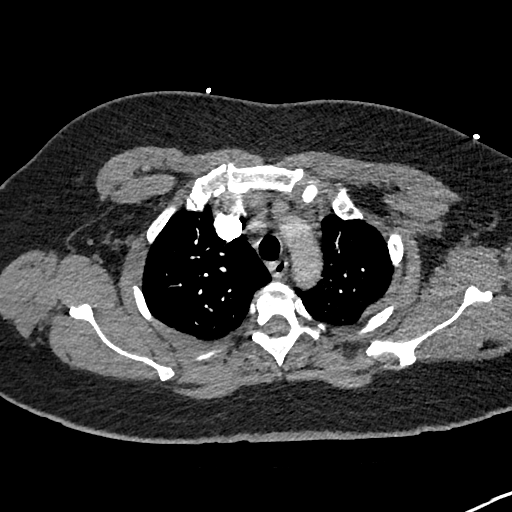
[im 245/278  lung]
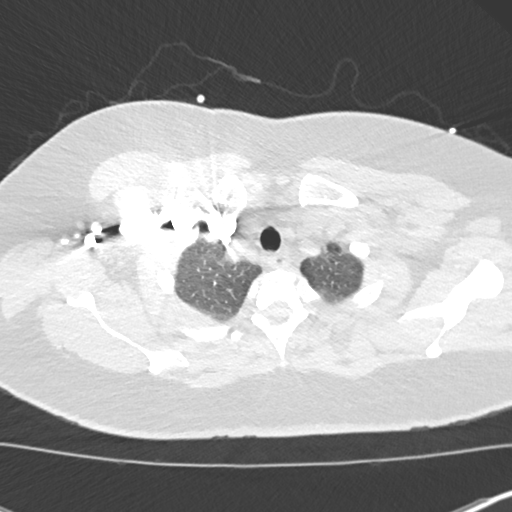
[im 261/278  soft-tissue]
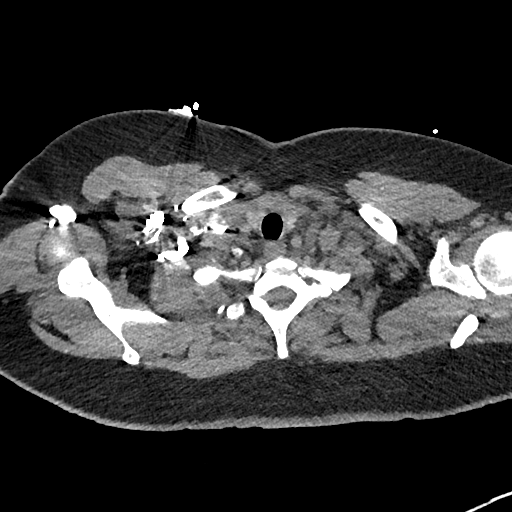

[Series 8: cor soft · coronal · 0.57mm/px · 3 of 118 slices shown]
[im 30/118  soft-tissue]
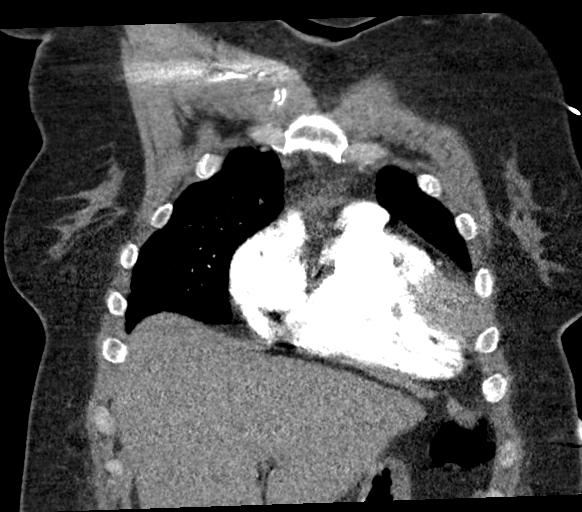
[im 59/118  soft-tissue]
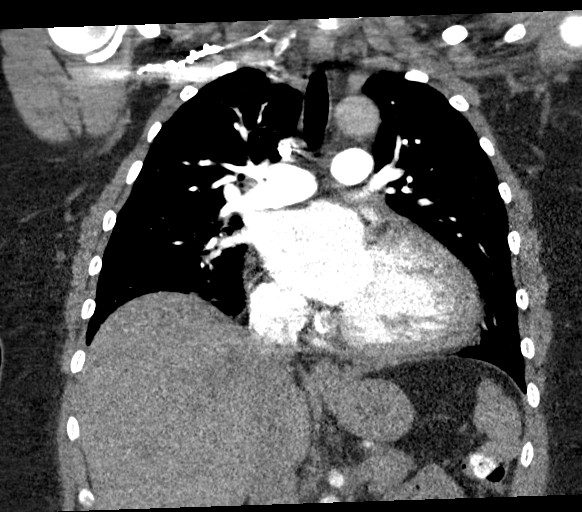
[im 88/118  soft-tissue]
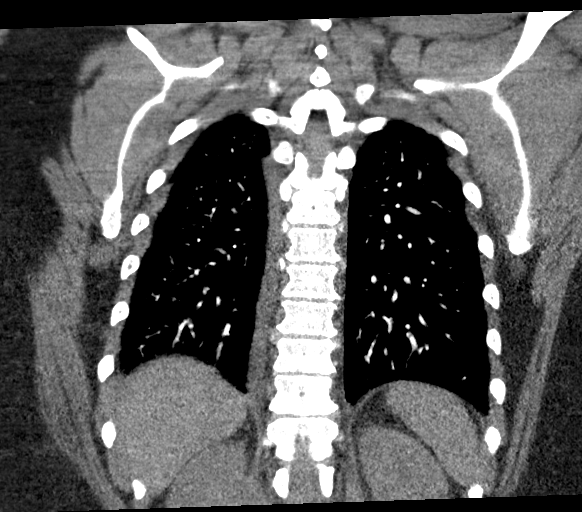

[19 of 46 positions shown; findings below may reference images not displayed]

FINDINGS: Cardiovascular: Satisfactory opacification of the pulmonary arteries
to the segmental level. No evidence of pulmonary embolism. Normal
heart size. No pericardial effusion.

Mediastinum/Nodes: No enlarged mediastinal, hilar, or axillary lymph
nodes. Thyroid gland, trachea, and esophagus demonstrate no
significant findings.

Lungs/Pleura: There is no evidence of acute infiltrate.

A small right pleural effusion is seen.

No pneumothorax is identified.

Upper Abdomen: No acute abnormality.

Musculoskeletal: No chest wall abnormality. No acute or significant
osseous findings.

Review of the MIP images confirms the above findings.
IMPRESSION: 1. No CT evidence of acute pulmonary embolism.
2. Small right pleural effusion.

## 2021-06-02 IMAGING — DX DG CHEST 2V
2 series · 2 of 2 positions shown · non-contrast
Comparison: None.

CLINICAL DATA: Shortness of breath

EXAM:
CHEST - 2 VIEW

[chest pa]
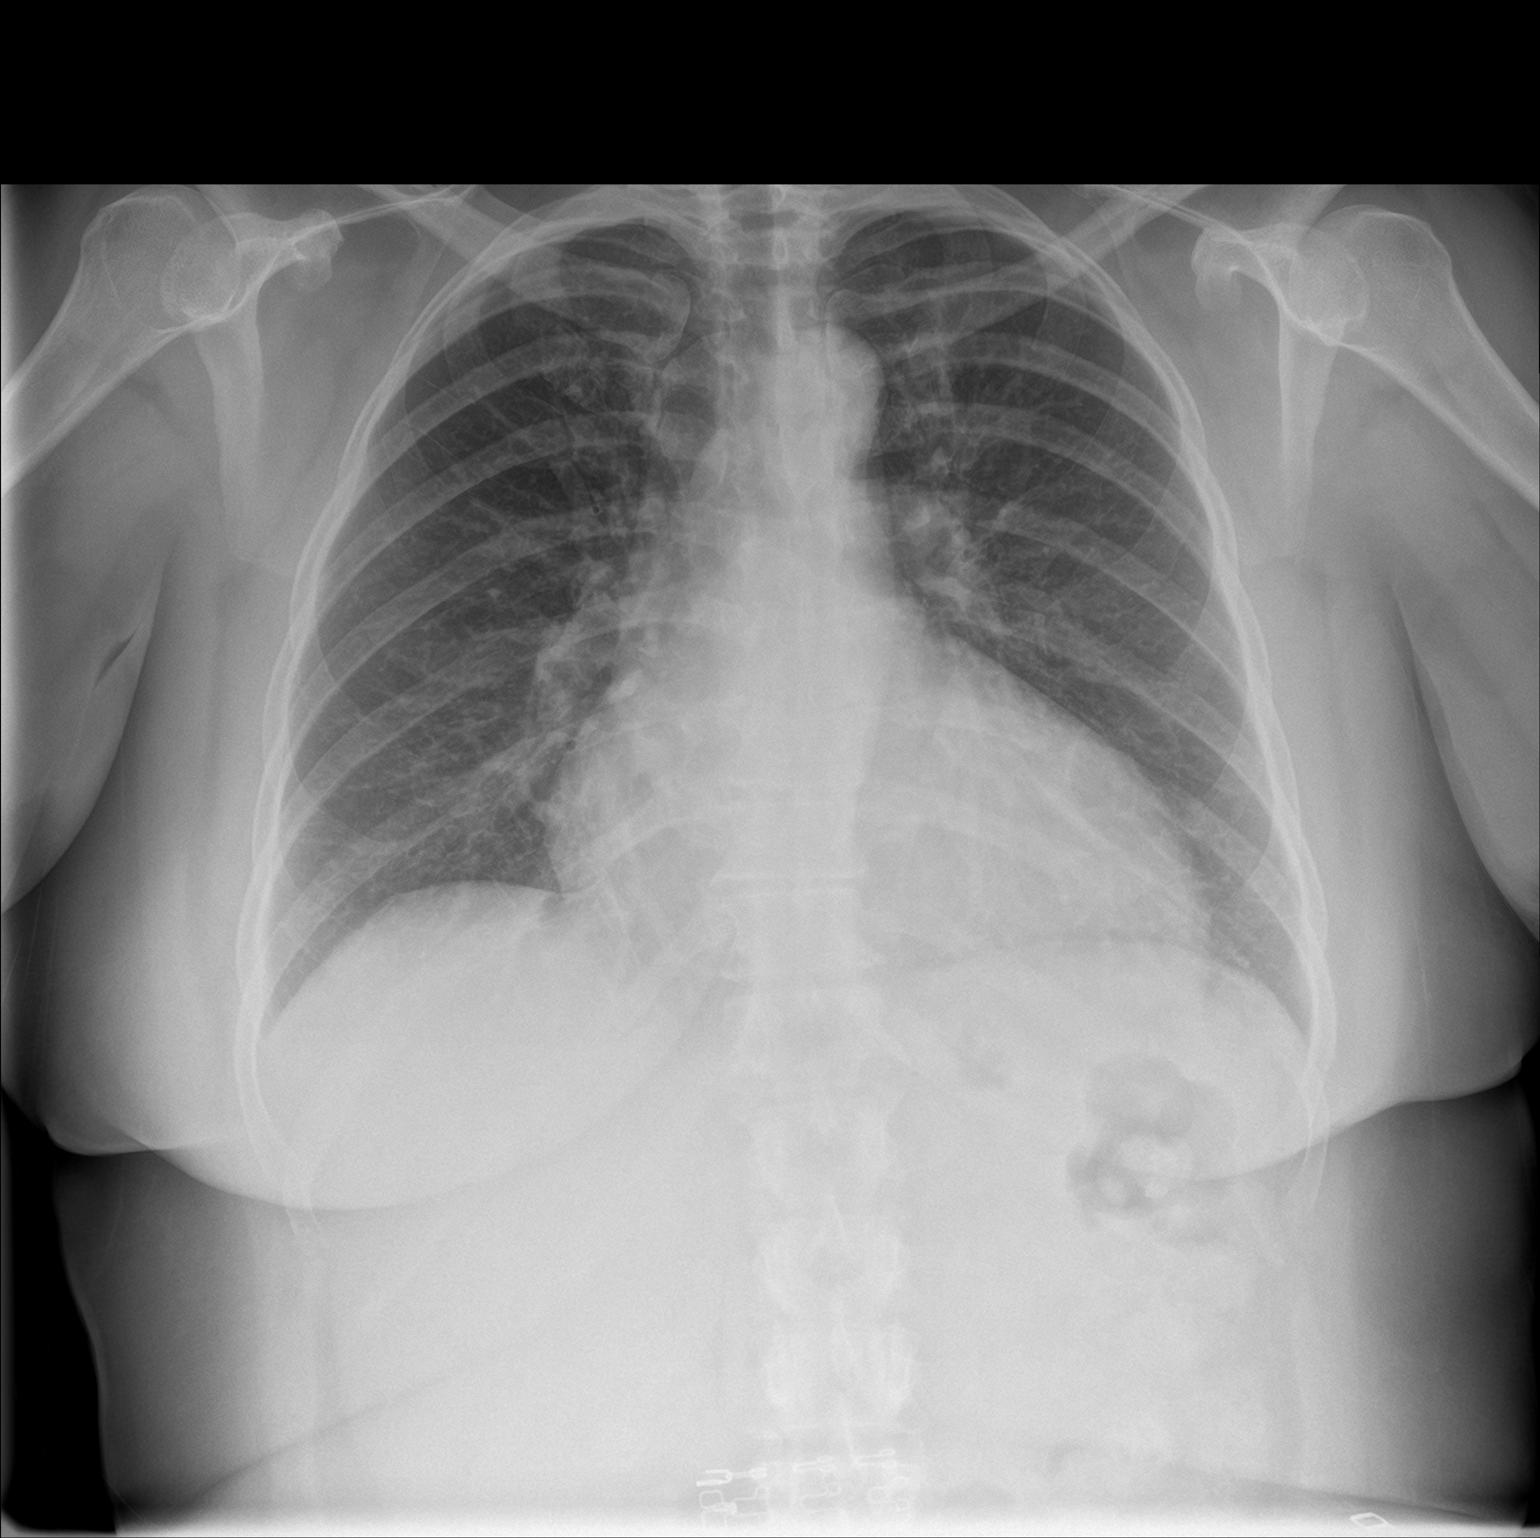

[chest lat]
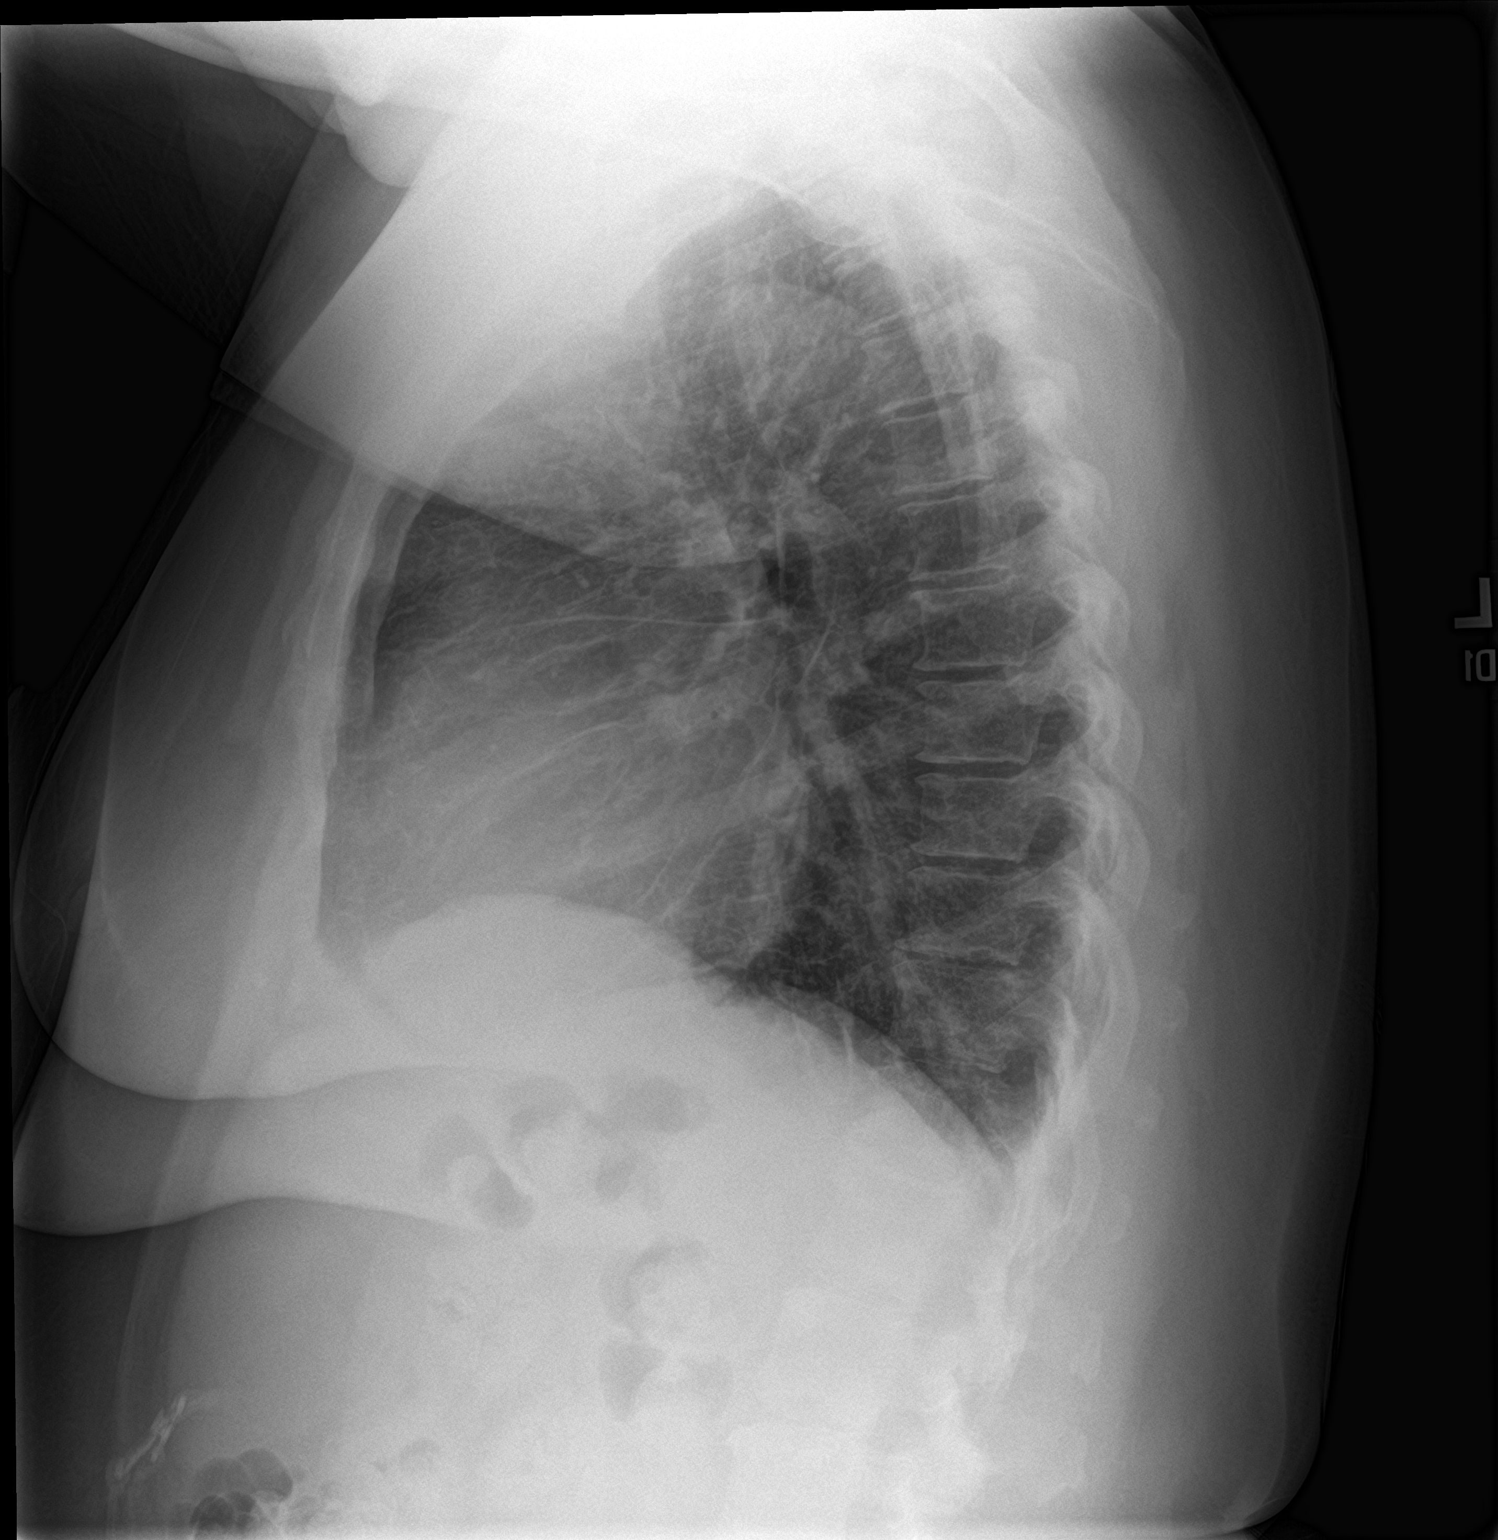

[2 of 2 positions shown; findings below may reference images not displayed]

FINDINGS: The heart size is enlarged. There is some vascular congestion with
early interstitial edema. There is no large focal infiltrate. No
pneumothorax. There is no significant pleural effusion.
IMPRESSION: Cardiomegaly with pulmonary vascular congestion and early
interstitial edema.

## 2021-06-30 ENCOUNTER — Ambulatory Visit: Payer: BC Managed Care – PPO | Admitting: Cardiology

## 2021-06-30 NOTE — Progress Notes (Deleted)
Cardiology Office Note  Date: 06/30/2021   ID: Denise Hood, DOB Oct 03, 1975, MRN [id number]  PCP:  [name], MD  Cardiologist:  Nona Dell, MD Electrophysiologist:  None   No chief complaint on file.   History of Present Illness: Denise Hood is a 45 y.o. female last seen in April.  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 12 tablet 0   amphetamine-dextroamphetamine (ADDERALL) 30 MG tablet Take 30 mg by mouth 2 (two) times daily.     carvedilol (COREG) 3.125 MG tablet Take 3.125 mg by mouth 2 (two) times daily with a meal.     diazepam (VALIUM) 5 MG tablet Take 5 mg by mouth every 12 (twelve) hours as needed (occipital neuralgia).     ferrous sulfate 325 (65 FE) MG tablet Take 1 tablet (325 mg total) by mouth 2 (two) times daily with a meal. 60 tablet 3   furosemide (LASIX) 40 MG tablet Take 1 tablet (40 mg total) by mouth daily. (Patient taking differently: Take 40 mg by mouth daily as needed.) 30 tablet 3   potassium chloride SA (KLOR-CON) 20 MEQ tablet Take 1 tablet (20 mEq total) by mouth daily. (Patient taking differently: Take 20 mEq by mouth daily as needed.) 30 tablet 3   rosuvastatin (CRESTOR) 5 MG tablet Take 5 mg by mouth daily.     sacubitril-valsartan (ENTRESTO) 24-26 MG Take 1 tablet by mouth 2 (two) times daily. 180 tablet 3   No current facility-administered medications for this visit.   Allergies:  Dilaudid [hydromorphone] and Fentanyl   Social History:  The patient  reports that she has never smoked. She has never used smokeless tobacco. She reports that she does not drink alcohol and does not use drugs.   Family History: The patient's family history includes Hyperlipidemia in her mother.   ROS:  Please see the history of present illness. Otherwise, complete review of systems is positive for {NONE DEFAULTED:18576}.  All other systems are reviewed and negative.   Physical Exam: VS:  There were no vitals taken for this visit., BMI There is no height or weight on file to calculate BMI.  Wt Readings from Last 3 Encounters:  11/25/20 207 lb (93.9 kg)  07/21/20 210 lb (95.3 kg)  03/22/20 201 lb 12.8 oz (91.5 kg)    General: Patient appears comfortable at rest. HEENT: Conjunctiva and lids normal, oropharynx clear with moist mucosa. Neck: Supple, no elevated JVP or carotid bruits, no thyromegaly. Lungs: Clear to auscultation, nonlabored breathing at rest. Cardiac: Regular rate and rhythm, no S3 or significant systolic murmur, no pericardial rub. Abdomen: Soft, nontender, no hepatomegaly, bowel sounds present, no guarding or rebound. Extremities: No pitting edema, distal pulses 2+. Skin: Warm and dry. Musculoskeletal: No kyphosis. Neuropsychiatric: Alert and oriented x3, affect grossly appropriate.  ECG:  An ECG dated 11/25/2020 was personally reviewed today and demonstrated:  Sinus rhythm with nonspecific T wave changes.  Recent  Labwork:  March 2022: Hemoglobin 13.4, MCV 86, platelets 538, BUN 11, creatinine 0.75, potassium 4.4, AST 17, ALT 10, cholesterol 242, triglycerides 177, HDL 44, LDL 165, hemoglobin A1c 6.7%  Other Studies Reviewed Today:  Echocardiogram 11/23/2020:  1. Left ventricular ejection fraction, by estimation, is 55 to 60%. The  left ventricle has normal function. The left ventricle has no regional  wall motion abnormalities. There is mild left ventricular hypertrophy.  Left ventricular diastolic parameters  were normal.  Normal global longitudinal strain of -17.5%.   2. Right ventricular systolic function is normal. The right ventricular  size is normal. There is normal pulmonary artery systolic pressure. The  estimated right ventricular systolic pressure is 20.5 mmHg.   3. The mitral valve is grossly normal. Trivial mitral valve  regurgitation.   4. The aortic valve is grossly normal. Aortic valve regurgitation is not  visualized.   5. The inferior vena cava is normal in size with greater than 50%  respiratory variability, suggesting right atrial pressure of 3 mmHg.   Assessment and Plan:    Medication Adjustments/Labs and Tests Ordered: Current medicines are reviewed at length with the patient today.  Concerns regarding medicines are outlined above.   Tests Ordered: No orders of the defined types were placed in this encounter.   Medication Changes: No orders of the defined types were placed in this encounter.   Disposition:  Follow up {follow up:15908}  Signed, Jonelle Sidle, MD, [facility] 06/30/2021 11:49 AM    [facility] at [facility] [address] [place], [place], [place] [address] Phone: [phone]; Fax: [phone]

## 2021-11-02 NOTE — Progress Notes (Addendum)
? ? ?Cardiology Office Note ? ?Date: 11/03/2021  ? ?ID: Denise Hood, DOB [birth year], MRN [id number] ? ?PCP:  Roe Rutherford, NP  ?Cardiologist:  Nona Dell, MD ?Electrophysiologist:  None  ? ?Chief Complaint  ?Patient presents with  ? Cardiac follow-up  ? ? ?History of Present Illness: ?Denise Hood is a 45 y.o. female last seen in April 2022.  She presents for a follow-up visit reporting recent increases in heart rate.  She was started on Ozempic about a week ago, otherwise no major change in her medications.  This past Wednesday she had a period of time and her heart rate was recorded over 140 by her Apple Watch.  This has not specifically occurred since that time, but she has felt like her heart rate has been just over 100 when she gets up to move around. ? ?She had routine follow-up with her PCP in February, I reviewed the note. ? ?Echocardiogram in April 2022 revealed LVEF normal range at 55 to 60%. ? ?Orthostatic measurements were not significantly abnormal today.  I personally reviewed her ECG which shows normal sinus rhythm with nonspecific T wave changes.  The ECG strips from her Apple Watch that I was able to review showed sinus rhythm, rare PACs. ? ?Past Medical History:  ?Diagnosis Date  ? Anemia   ? Atrial flutter (HCC)   ? Documented March 2021  ? Bacterial meningitis   ? Secondary cardiomyopathy (HCC)   ? 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 ? ?Past Surgical History:  ?Procedure Laterality Date  ? Abscess surgery    ? TUBAL LIGATION    ? ? ?Current Outpatient Medications  ?Medication Sig Dispense Refill  ? acetaminophen (TYLENOL) 325 MG tablet Take 2 tablets (650 mg total) by mouth every 6 (six) hours as needed for mild pain, fever or headache (or Fever >/= 101). 12 tablet 0  ? amphetamine-dextroamphetamine (ADDERALL) 30 MG tablet Take 30 mg by mouth 2 (two) times daily.    ? carvedilol (COREG) 6.25 MG tablet Take 1 tablet (6.25 mg total) by mouth 2 (two) times daily. 180  tablet 3  ? diazepam (VALIUM) 5 MG tablet Take 5 mg by mouth every 12 (twelve) hours as needed (occipital neuralgia).    ? ferrous sulfate 325 (65 FE) MG tablet Take 1 tablet (325 mg total) by mouth 2 (two) times daily with a meal. 60 tablet 3  ? furosemide (LASIX) 40 MG tablet Take 40 mg by mouth as needed.    ? OZEMPIC, 0.25 OR 0.5 MG/DOSE, 2 MG/1.5ML SOPN Inject 0.5 mg into the skin once a week.    ? potassium chloride SA (KLOR-CON M) 20 MEQ tablet Take 20 mEq by mouth as needed.    ? rosuvastatin (CRESTOR) 5 MG tablet Take 5 mg by mouth daily.    ? sacubitril-valsartan (ENTRESTO) 24-26 MG Take 1 tablet by mouth 2 (two) times daily. (Patient taking differently: Take 1 tablet by mouth daily.) 180 tablet 3  ? ?No current facility-administered medications for this visit.  ? ?Allergies:  Dilaudid [hydromorphone] and Fentanyl  ? ?ROS: No syncope. ? ?Physical Exam: ?VS:  BP 106/78   Pulse 87   Ht 5' (1.524 m)   Wt 193 lb 3.2 oz (87.6 kg)   SpO2 97%   BMI 37.73 kg/m? , BMI Body mass index is 37.73 kg/m?. ? ?Wt Readings from Last 3 Encounters:  ?11/03/21 193 lb 3.2 oz (87.6 kg)  ?11/25/20 207 lb (93.9 kg)  ?07/21/20 210 lb (95.3  kg)  ?  ?General: Patient appears comfortable at rest. ?HEENT: Conjunctiva and lids normal, wearing a mask. ?Neck: Supple, no elevated JVP or carotid bruits, no thyromegaly. ?Lungs: Clear to auscultation, nonlabored breathing at rest. ?Cardiac: Regular rate and rhythm, no S3 or significant systolic murmur, no pericardial rub. ?Extremities: No pitting edema. ? ?ECG:  An ECG dated 11/25/2020 was personally reviewed today and demonstrated:  Sinus rhythm with nonspecific T wave changes. ? ?Recent Labwork: ? ?February 2023: Hemoglobin A1c 7%, hemoglobin 12.9, platelets 559, BUN 13, creatinine 0.7, potassium 4.4, AST 14, ALT 6, cholesterol 211, triglycerides 172, HDL 47, LDL 133, TSH 3.14 ? ?Other Studies Reviewed Today: ? ?Echocardiogram 11/23/2020: ? 1. Left ventricular ejection fraction, by  estimation, is 55 to 60%. The  ?left ventricle has normal function. The left ventricle has no regional  ?wall motion abnormalities. There is mild left ventricular hypertrophy.  ?Left ventricular diastolic parameters  ?were normal. Normal global longitudinal strain of -17.5%.  ? 2. Right ventricular systolic function is normal. The right ventricular  ?size is normal. There is normal pulmonary artery systolic pressure. The  ?estimated right ventricular systolic pressure is 20.5 mmHg.  ? 3. The mitral valve is grossly normal. Trivial mitral valve  ?regurgitation.  ? 4. The aortic valve is grossly normal. Aortic valve regurgitation is not  ?visualized.  ? 5. The inferior vena cava is normal in size with greater than 50%  ?respiratory variability, suggesting right atrial pressure of 3 mmHg.  ? ?Assessment and Plan: ? ?1.  Recent sense of palpitations with increased heart rate as discussed above.  She is in sinus rhythm by ECG today.  Plan is to obtain a 7-day Zio patch to better investigate heart rate variability and rhythm.  She is not orthostatic.  Could be experiencing some intravascular volume depletion with recent start of Ozempic.  Needs to monitor fluid status. ? ?2.  History of typical atrial flutter with CHA2DS2-VASc score of 1.  Rhythm has not been specifically documented for quite some time. ? ?3.  History of tachycardia induced cardiomyopathy with normalization of LVEF, most recently 55 to 60% range.  She has been taking Coreg and Entresto only once daily, plan to increase these to twice daily and then determine if she needs any further up titration.  She is also on as needed Lasix with potassium supplement. ? ?Medication Adjustments/Labs and Tests Ordered: ?Current medicines are reviewed at length with the patient today.  Concerns regarding medicines are outlined above.  ? ?Tests Ordered: ?No orders of the defined types were placed in this encounter. ? ? ?Medication Changes: ?Meds ordered this encounter   ?Medications  ? carvedilol (COREG) 6.25 MG tablet  ?  Sig: Take 1 tablet (6.25 mg total) by mouth 2 (two) times daily.  ?  Dispense:  180 tablet  ?  Refill:  3  ?  11/03/2021 dose increase  ? ? ?Disposition:  Follow up  test results. ? ?Signed, ?Jonelle Sidle, MD, [facility] ?11/03/2021 11:04 AM    ?[facility] at [facility] ?[address] [place], [place], [place] [address] ?Phone: [phone]; Fax: [phone]  ?

## 2021-11-03 ENCOUNTER — Other Ambulatory Visit: Payer: Self-pay | Admitting: Cardiology

## 2021-11-03 ENCOUNTER — Encounter: Payer: Self-pay | Admitting: Cardiology

## 2021-11-03 ENCOUNTER — Ambulatory Visit (INDEPENDENT_AMBULATORY_CARE_PROVIDER_SITE_OTHER): Payer: BC Managed Care – PPO | Admitting: Cardiology

## 2021-11-03 ENCOUNTER — Ambulatory Visit (INDEPENDENT_AMBULATORY_CARE_PROVIDER_SITE_OTHER): Payer: BC Managed Care – PPO

## 2021-11-03 VITALS — BP 106/78 | HR 87 | Ht 60.0 in | Wt 193.2 lb

## 2021-11-03 DIAGNOSIS — R002 Palpitations: Secondary | ICD-10-CM

## 2021-11-03 DIAGNOSIS — Z8679 Personal history of other diseases of the circulatory system: Secondary | ICD-10-CM | POA: Diagnosis not present

## 2021-11-03 MED ORDER — CARVEDILOL 6.25 MG PO TABS: 6.2500 mg | ORAL_TABLET | Freq: Two times a day (BID) | ORAL | 3 refills | Status: DC

## 2021-11-03 MED ORDER — CARVEDILOL 3.125 MG PO TABS: 3.1250 mg | ORAL_TABLET | Freq: Two times a day (BID) | ORAL | 3 refills | Status: AC

## 2021-11-03 NOTE — Patient Instructions (Addendum)
Medication Instructions:  ?Your physician has recommended you make the following change in your medication:  ?Take carvedilol 3.125 mg twice daily ?Take entresto 24/26 mg twice daily ?Continue other medications the same ? ?Labwork: ?none ? ?Testing/Procedures: ?ZIO- Long Term Monitor Instructions  ? ?Your physician has requested you wear your ZIO patch monitor 7 days.  ? ?This is a single patch monitor.  [employer] supplies one patch monitor per enrollment.  Additional stickers are not available. ?  ?Please do not apply patch if you will be having a Nuclear Stress Test, Echocardiogram, Cardiac CT, MRI, or Chest Xray during the time frame you would be wearing the monitor. The patch cannot be worn during these tests.  You cannot remove and re-apply the ZIO XT patch monitor. ?  ?  ?Once you have received you monitor, please review enclosed instructions.  Your monitor has already been registered assigning a specific monitor serial # to you. ? ? ?Applying the monitor  ? ?Shave hair from upper left chest. ?  ?Hold abrader disc by orange tab.  Rub abrader in 40 strokes over left upper chest as indicated in your monitor instructions. ?  ?Clean area with 4 enclosed alcohol pads .  Use all pads to assure are is cleaned thoroughly.  Let dry.  ? ?Apply patch as indicated in monitor instructions.  Patch will be place under collarbone on left side of chest with arrow pointing upward. ?  ?Rub patch adhesive wings for 2 minutes.Remove white label marked "1".  Remove white label marked "2".  Rub patch adhesive wings for 2 additional minutes. ?  ?While looking in a mirror, press and release button in center of patch.  A small green light will flash 3-4 times .  This will be your only indicator the monitor has been turned on. ?    ?Do not shower for the first 24 hours.  You may shower after the first 24 hours. ?  ?Press button if you feel a symptom. You will hear a small click.  Record Date, Time and Symptom in the Patient Log Book. ?   ?When you are ready to remove patch, follow instructions on last 2 pages of Patient Log Book.  Stick patch monitor onto last page of Patient Log Book. ?  ?Place Patient Log Book in [facility] box.  Use locking tab on box and tape box closed securely.  The Orange and [employer] has [employer] on it.  Please place in mailbox as soon as possible.  Your physician should have your test results approximately 7 days after the monitor has been mailed back to [facility]. ?  ?Call [facility] at [phone] if you have questions regarding your ZIO XT patch monitor.  Call them immediately if you see an orange light blinking on your monitor. ?  ?If your monitor falls off in less than 4 days contact our Monitor department at [phone].  If your monitor becomes loose or falls off after 4 days call [employer] at [phone] for suggestions on securing your monitor. ? ?Follow-Up: ?Your physician recommends that you schedule a follow-up appointment in: pending ? ?Any Other Special Instructions Will Be Listed Below (If Applicable). ? ?If you need a refill on your cardiac medications before your next appointment, please call your pharmacy. ?

## 2021-11-03 NOTE — Addendum Note (Signed)
Addended by: Merlene Laughter on: 11/03/2021 03:40 PM ? ? Modules accepted: Orders ? ?

## 2021-11-03 NOTE — Addendum Note (Signed)
Addended by: Eustace Moore on: 11/03/2021 11:18 AM ? ? Modules accepted: Orders ? ?

## 2021-11-24 ENCOUNTER — Encounter: Payer: Self-pay | Admitting: Cardiology

## 2023-02-06 ENCOUNTER — Other Ambulatory Visit (HOSPITAL_COMMUNITY): Payer: Self-pay | Admitting: Adult Health Nurse Practitioner

## 2023-02-06 DIAGNOSIS — Z1231 Encounter for screening mammogram for malignant neoplasm of breast: Secondary | ICD-10-CM

## 2023-04-30 ENCOUNTER — Other Ambulatory Visit: Payer: Self-pay

## 2023-04-30 ENCOUNTER — Encounter (HOSPITAL_COMMUNITY): Payer: Self-pay | Admitting: Emergency Medicine

## 2023-04-30 ENCOUNTER — Emergency Department (HOSPITAL_COMMUNITY)
Admission: EM | Admit: 2023-04-30 | Discharge: 2023-04-30 | Disposition: A | Discharge status: Home / Self Care | Payer: BC Managed Care – PPO | Attending: Emergency Medicine | Admitting: Emergency Medicine

## 2023-04-30 ENCOUNTER — Emergency Department (HOSPITAL_COMMUNITY): Payer: BC Managed Care – PPO

## 2023-04-30 DIAGNOSIS — M7989 Other specified soft tissue disorders: Secondary | ICD-10-CM | POA: Diagnosis not present

## 2023-04-30 DIAGNOSIS — W208XXA Other cause of strike by thrown, projected or falling object, initial encounter: Secondary | ICD-10-CM | POA: Diagnosis not present

## 2023-04-30 DIAGNOSIS — S9031XA Contusion of right foot, initial encounter: Secondary | ICD-10-CM | POA: Diagnosis not present

## 2023-04-30 DIAGNOSIS — S99921A Unspecified injury of right foot, initial encounter: Secondary | ICD-10-CM | POA: Diagnosis present

## 2023-04-30 NOTE — ED Provider Notes (Signed)
[place] EMERGENCY DEPARTMENT AT [facility] Provider Note   CSN: [id number] Arrival date & time: 04/30/23  1600     History  Chief Complaint  Patient presents with   Foot Pain    Denise Hood is a 47 y.o. female.   Foot Pain       Denise Hood is a 47 y.o. female who presents to the Emergency Department complaining of right foot pain swelling and bruising x 4 days.  States that a piece of metal fell on top of her foot.  She noticed gradually increased swelling with bruising along the base of her toes and into her ankle.  She is able to bear weight to the foot but mostly on her heel.  She has pain when she bears weight to the ball of her foot or toes.  Denies pain or swelling of her lower leg or other injuries.  Home Medications Prior to Admission medications   Medication Sig Start Date End Date Taking? Authorizing Provider  acetaminophen (TYLENOL) 325 MG tablet Take 2 tablets (650 mg total) by mouth every 6 (six) hours as needed for mild pain, fever or headache (or Fever >/= 101). 11/05/19   Shon Hale, MD  amphetamine-dextroamphetamine (ADDERALL) 30 MG tablet Take 30 mg by mouth 2 (two) times daily.    [provider]  carvedilol (COREG) 3.125 MG tablet Take 1 tablet (3.125 mg total) by mouth 2 (two) times daily. 11/03/21   Jonelle Sidle, MD  diazepam (VALIUM) 5 MG tablet Take 5 mg by mouth every 12 (twelve) hours as needed (occipital neuralgia).    [provider]  ferrous sulfate 325 (65 FE) MG tablet Take 1 tablet (325 mg total) by mouth 2 (two) times daily with a meal. 11/05/19   Emokpae, Courage, MD  furosemide (LASIX) 40 MG tablet Take 40 mg by mouth as needed.    [provider]  OZEMPIC, 0.25 OR 0.5 MG/DOSE, 2 MG/1.5ML SOPN Inject 0.5 mg into the skin once a week. 10/23/21   [provider]  potassium chloride SA (KLOR-CON M) 20 MEQ tablet Take 20 mEq by mouth as needed.    [provider]  rosuvastatin  (CRESTOR) 5 MG tablet Take 5 mg by mouth daily.    [provider]  sacubitril-valsartan (ENTRESTO) 24-26 MG Take 1 tablet by mouth 2 (two) times daily. Patient taking differently: Take 1 tablet by mouth daily. 02/27/21   Kroeger, Ovidio Kin., PA-C      Allergies    Dilaudid [hydromorphone] and Fentanyl    Review of Systems   Review of Systems  Musculoskeletal:  Positive for arthralgias (Right foot pain and swelling). Negative for back pain and neck stiffness.  Skin:  Positive for color change.  Neurological:  Positive for weakness. Negative for numbness.    Physical Exam Updated Vital Signs BP (!) 164/102 (BP Location: Right Arm)   Pulse 70   Temp 98.5 F (36.9 C) (Oral)   Resp 15   Ht 5' (1.524 m)   Wt 79.4 kg   LMP 04/09/2023 (Exact Date)   SpO2 100%   BMI 34.18 kg/m  Physical Exam Vitals and nursing note reviewed.  Constitutional:      General: She is not in acute distress.    Appearance: Normal appearance. She is not toxic-appearing.  Cardiovascular:     Rate and Rhythm: Normal rate and regular rhythm.     Pulses: Normal pulses.  Pulmonary:     Effort: Pulmonary effort  is normal.  Musculoskeletal:        General: Tenderness and signs of injury present. No swelling or deformity.     Comments: Ecchymosis along the base of the toes of the right foot, there is bruising also noted along the lateral aspect of the mid and hindfoot.  Mild swelling dorsal foot.  Small abrasion lateral foot no surrounding erythema.  Appears to be healing well.  Compartments are soft  Skin:    General: Skin is warm.     Capillary Refill: Capillary refill takes less than 2 seconds.     Findings: Bruising present.  Neurological:     General: No focal deficit present.     Mental Status: She is alert.     Sensory: No sensory deficit.     Motor: No weakness.     ED Results / Procedures / Treatments   Labs (all labs ordered are listed, but only abnormal results are displayed) Labs  Reviewed - No data to display  EKG None  Radiology DG Foot Complete Right  Result Date: 04/30/2023 CLINICAL DATA:  Foot injury bruising and swelling EXAM: RIGHT FOOT COMPLETE - 3+ VIEW COMPARISON:  None Available. FINDINGS: No acute fracture or malalignment. Mild degenerative change at the first MTP joint. Remote injury or accessory ossicle adjacent to the cuboid. IMPRESSION: No acute osseous abnormality Electronically Signed   By: Jasmine Pang M.D.   On: 04/30/2023 20:01    Procedures Procedures    Medications Ordered in ED Medications - No data to display  ED Course/ Medical Decision Making/ A&P                                 Medical Decision Making Patient here for evaluation of foot pain after direct blow to the top of her foot 4 days ago.  Able to bear weight.  Denies any numbness of her foot.  Small superficial abrasion lateral foot appears to be healing well.  No surrounding erythema or drainage of the wound.  Patient's Td is up-to-date.  Contusion, fracture, dislocation all considered  Amount and/or Complexity of Data Reviewed Radiology: ordered.    Details: X-ray of the right foot shows no acute bony abnormalities. Discussion of management or test interpretation with external provider(s): Patient's Td is up-to-date, suspect contusion of the foot.  Postop shoe applied and crutches given for ambulation.  She agrees to RICE therapy and close outpatient follow-up with orthopedics in 1 week if not improving.  Have recommended ibuprofen for pain.           Final Clinical Impression(s) / ED Diagnoses Final diagnoses:  None    Rx / DC Orders ED Discharge Orders     None         Rosey Bath 04/30/23 2038    Pricilla Loveless, MD 05/03/23 [phone]

## 2023-04-30 NOTE — ED Triage Notes (Signed)
Pt dropped a piece of metal on the top of right foot 4 days ago. Pt had appointment to see PCP today and the office called and cancelled . Pt has swelling and bruised noted to top of foot. Pt was ambulated with a limp into triage.

## 2023-04-30 NOTE — Discharge Instructions (Signed)
Elevate your foot when possible.  Ice packs on and off to help with swelling.  Use the crutches for walking or standing.  Follow-up with the orthopedic provider listed in 1 week if your symptoms or not improving.

## 2024-01-08 ENCOUNTER — Other Ambulatory Visit: Payer: Self-pay | Admitting: Adult Health Nurse Practitioner

## 2024-01-08 DIAGNOSIS — Z1231 Encounter for screening mammogram for malignant neoplasm of breast: Secondary | ICD-10-CM

## 2024-03-17 ENCOUNTER — Ambulatory Visit: Attending: Cardiology

## 2024-03-17 ENCOUNTER — Ambulatory Visit: Attending: Cardiology | Admitting: Cardiology

## 2024-03-17 ENCOUNTER — Encounter: Payer: Self-pay | Admitting: Cardiology

## 2024-03-17 VITALS — BP 138/80 | HR 74 | Ht 60.0 in | Wt 179.8 lb

## 2024-03-17 DIAGNOSIS — I484 Atypical atrial flutter: Secondary | ICD-10-CM | POA: Diagnosis not present

## 2024-03-17 DIAGNOSIS — I4892 Unspecified atrial flutter: Secondary | ICD-10-CM | POA: Diagnosis not present

## 2024-03-17 DIAGNOSIS — R001 Bradycardia, unspecified: Secondary | ICD-10-CM | POA: Diagnosis not present

## 2024-03-17 DIAGNOSIS — Z8679 Personal history of other diseases of the circulatory system: Secondary | ICD-10-CM

## 2024-03-17 NOTE — Progress Notes (Signed)
    Cardiology Office Note  Date: 03/17/2024   ID: Denise Hood, DOB [birth year], MRN [id number]  History of Present Illness: Denise Hood is a 48 y.o. female last seen in March 2023.  She presents overdue for follow-up for discussion of interval symptoms.  She states that recently she had a 2 to 3-day period when she was experiencing episodes of weakness, difficulty getting out of bed, also heart rates in the 40s by her smart watch.  She does not report any sense of rapid palpitations.  No exertional chest pain.  Overall weight is down in comparison to 2023.  No frank syncope.  Went over her medications.  She states that she takes both Coreg  and Entresto  once daily.  Uses Lasix  with potassium supplements a few days a week for mild swelling in her legs.  I did review her most recent lab work which is noted below.  I reviewed her ECG today which shows normal sinus rhythm.  Last echocardiogram was in 2022.  Physical Exam: VS:  BP 138/80 (BP Location: Left Arm, Cuff Size: Normal)   Pulse 74   Ht 5' (1.524 m)   Wt 179 lb 12.8 oz (81.6 kg)   SpO2 100%   BMI 35.11 kg/m , BMI Body mass index is 35.11 kg/m.  Wt Readings from Last 3 Encounters:  03/17/24 179 lb 12.8 oz (81.6 kg)  04/30/23 175 lb (79.4 kg)  11/03/21 193 lb 3.2 oz (87.6 kg)    General: Patient appears comfortable at rest. HEENT: Conjunctiva and lids normal. Neck: Supple, no elevated JVP or carotid bruits. Lungs: Clear to auscultation, nonlabored breathing at rest. Cardiac: Regular rate and rhythm, no S3 or significant systolic murmur. Extremities: No pitting edema.  ECG:  An ECG dated 11/03/2021 was personally reviewed today and demonstrated:  Sinus rhythm with nonspecific T wave changes.  Labwork:  October 2024: Hemoglobin 10.6, platelets 572, BUN 14, creatinine 0.79, GFR 92, potassium 4.6, AST 14, ALT 5, cholesterol 170, triglycerides 142, HDL 44, LDL 101 February 2025: TSH 1.88  Other Studies Reviewed Today:  No  interval cardiac testing for review today.  Assessment and Plan:  1.  History of typical atrial flutter with CHA2DS2-VASc score of 1.  No obvious recurrence over time.  Have held off consideration of anticoagulation in light of history of chronic anemia.  ECG is normal today.  2.  Reported intermittent bradycardia with heart rate in the 40s at times, normal today.  Does report associated weakness and fatigue during the time that this was occurring.  Plan to obtain 72-hour ZIO monitor to investigate heart rate variability and rhythm.  She has had no syncope.   3.  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  Plan to update echocardiogram to ensure normal LVEF.  She has been taking Coreg  3.125 mg and Entresto  24/26 mg once daily.  We did discuss simplifying her regimen depending on cardiac monitor and echocardiogram results.  Disposition:  Follow up test results.  Signed, Jayson JUDITHANN Sierras, M.D., F.A.C.C. [facility] at [facility]

## 2024-03-17 NOTE — Patient Instructions (Signed)
 Medication Instructions:  Your physician recommends that you continue on your current medications as directed. Please refer to the Current Medication list given to you today.   Labwork: None today  Testing/Procedures: ZIO XT- Long Term Monitor Instructions   Your physician has requested you wear your ZIO patch monitor_____3__days.   This is a single patch monitor.  [employer] supplies one patch monitor per enrollment.  Additional stickers are not available.   Do not shower for the first 24 hours.  You may shower after the first 24 hours.   Press button if you feel a symptom. You will hear a small click.  Record Date, Time and Symptom in the Patient Log Book.   When you are ready to remove patch, follow instructions on last 2 pages of Patient Log Book.  Stick patch monitor onto last page of Patient Log Book.   Place Patient Log Book in [place] box.  Use locking tab on box and tape box closed securely.  The Orange and [employer] has [employer] on it.  Please place in mailbox as soon as possible.  Your physician should have your test results approximately 7 days after the monitor has been mailed back to [facility].   Call [facility] Customer Care at [phone] if you have questions regarding your ZIO XT patch monitor.  Call them immediately if you see an orange light blinking on your monitor.   If your monitor falls off in less than 4 days contact our Monitor department at [phone].  If your monitor becomes loose or falls off after 4 days call [employer] at [phone] for suggestions on securing your monitor.      Your physician has requested that you have an echocardiogram. Echocardiography is a painless test that uses sound waves to create images of your heart. It provides your doctor with information about the size and shape of your heart and how well your heart's chambers and valves are working. This procedure takes approximately one hour. There are no restrictions for  this procedure. Please do NOT wear cologne, perfume, aftershave, or lotions (deodorant is allowed). Please arrive 15 minutes prior to your appointment time.  Please note: We ask at that you not bring children with you during ultrasound (echo/ vascular) testing. Due to room size and safety concerns, children are not allowed in the ultrasound rooms during exams. Our front office staff cannot provide observation of children in our lobby area while testing is being conducted. An adult accompanying a patient to their appointment will only be allowed in the ultrasound room at the discretion of the ultrasound technician under special circumstances. We apologize for any inconvenience.   Follow-Up: To  be determined after testing  Any Other Special Instructions Will Be Listed Below (If Applicable).  If you need a refill on your cardiac medications before your next appointment, please call your pharmacy.

## 2024-04-07 ENCOUNTER — Other Ambulatory Visit

## 2024-04-08 ENCOUNTER — Ambulatory Visit: Payer: Self-pay | Admitting: Cardiology

## 2024-04-08 DIAGNOSIS — R001 Bradycardia, unspecified: Secondary | ICD-10-CM | POA: Diagnosis not present

## 2024-04-28 ENCOUNTER — Ambulatory Visit: Attending: Cardiology

## 2024-04-28 DIAGNOSIS — Z8679 Personal history of other diseases of the circulatory system: Secondary | ICD-10-CM

## 2024-04-28 DIAGNOSIS — I429 Cardiomyopathy, unspecified: Secondary | ICD-10-CM

## 2024-04-29 LAB — ECHOCARDIOGRAM COMPLETE
AR max vel: 3.02 cm2
AV Area VTI: 1.98 cm2
AV Area mean vel: 1.92 cm2
AV Mean grad: 5.3 mmHg
AV Peak grad: 5 mmHg
Ao pk vel: 1.11 m/s
Area-P 1/2: 3.87 cm2
Calc EF: 71.2 %
S' Lateral: 3 cm
Single Plane A2C EF: 67.4 %
Single Plane A4C EF: 75.7 %

## 2024-07-03 ENCOUNTER — Ambulatory Visit: Attending: Cardiology | Admitting: Cardiology

## 2024-07-03 ENCOUNTER — Encounter: Payer: Self-pay | Admitting: Cardiology

## 2024-07-03 VITALS — BP 148/90 | HR 77 | Ht 60.0 in | Wt 179.2 lb

## 2024-07-03 DIAGNOSIS — Z8679 Personal history of other diseases of the circulatory system: Secondary | ICD-10-CM | POA: Diagnosis not present

## 2024-07-03 DIAGNOSIS — I4892 Unspecified atrial flutter: Secondary | ICD-10-CM

## 2024-07-03 NOTE — Progress Notes (Signed)
 Cardiology Office Note  Date: 07/03/2024   ID: Marcey Persad, DOB 1976-02-08, MRN [id number]  History of Present Illness: Denise Hood is a 48 y.o. female last seen in July.  She is here for a follow-up visit.  She does not report any significant palpitations, no sudden dizziness or syncope.  We discussed the results of her cardiac monitor and echocardiogram as noted below.  We went over her medications.  She continues to take Coreg  3.125 mg and Entresto  24/26 mg both once daily.  I talked with her again about optimal dosing being twice daily, and if she does not tolerate this, we need to switch her to different medications that can be taken effectively once daily.  She still has evidence of iron deficiency anemia per review of lab work.  She states that this has been felt to be related to heavy menses in the past, although now apparently menopausal.  Still has iron supplements on her medication list.  This is being followed by her PCP.  Physical Exam: VS:  Ht 5' (1.524 m)   Wt 179 lb 3.2 oz (81.3 kg)   BMI 35.00 kg/m , BMI Body mass index is 35 kg/m.  Wt Readings from Last 3 Encounters:  07/03/24 179 lb 3.2 oz (81.3 kg)  03/17/24 179 lb 12.8 oz (81.6 kg)  04/30/23 175 lb (79.4 kg)    General: Patient appears comfortable at rest. HEENT: Conjunctiva and lids normal. Lungs: Clear to auscultation, nonlabored breathing at rest. Cardiac: Regular rate and rhythm, no S3 or significant systolic murmur.  ECG:  An ECG dated 03/17/2024 was personally reviewed today and demonstrated:  Normal sinus rhythm.  Labwork:  February 2025: Iron 39 with percent saturation 28 May 2024: TSH 2.84, hemoglobin 10.6, MCV 84, platelets 494, BUN 16, creatinine 0.75, potassium 4.5, GFR 97, AST 20, ALT 11, cholesterol 193, glycerides 90, HDL 50, LDL 127  Other Studies Reviewed Today:  Cardiac follow-up 04/08/2024: ZIO monitor reviewed.  2 days, 23 hours analyzed.   Predominant rhythm is sinus with  heart rate ranging from 44 bpm up to 134 bpm and average heart rate 83 bpm. There were rare PACs including atrial couplets and triplets representing less than 1% total beats. There were rare PVCs including ventricular couplets representing less than 1% total beats. Brief episodes of atrial flutter with variable block noted representing less than 1% total rhythm burden.  Longest episode was 1 minute in duration.  Average heart rate around 150 bpm during these events. There were no pauses or high degree heart block.  Echocardiogram 04/28/2024:  1. Left ventricular ejection fraction, by estimation, is 60 to 65%. The  left ventricle has normal function. The left ventricle has no regional  wall motion abnormalities. Left ventricular diastolic parameters are  consistent with Grade I diastolic  dysfunction (impaired relaxation). The average left ventricular global  longitudinal strain is -18.6 %. The global longitudinal strain is normal.   2. Right ventricular systolic function is normal. The right ventricular  size is normal. There is normal pulmonary artery systolic pressure.   3. The mitral valve is normal in structure. Trivial mitral valve  regurgitation. No evidence of mitral stenosis.   4. The tricuspid valve is abnormal.   5. The aortic valve was not well visualized. Aortic valve regurgitation  is not visualized. No aortic stenosis is present.   6. The inferior vena cava is normal in size with greater than 50%  respiratory variability, suggesting right atrial pressure of 3  mmHg.   Assessment and Plan:  1.  History of typical atrial flutter with CHA2DS2-VASc score of 1.  Cardiac monitor in August showed brief episodes representing less than 1% total rhythm burden.  Anticoagulation has not been pursued in the setting of chronic recurring iron deficiency anemia which is being managed by her PCP.   2.  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  Echocardiogram in  September revealed LVEF 60 to 65% range with normal RV contraction.  Medications discussed today, she will try to take Coreg  3.125 mg twice daily and Entresto  24/26 mg twice daily.  If not tolerated, we could always consider switching to a different regimen that could be taken on a daily basis (possibly bisoprolol and Cozaar).  3.  Chronic recurring iron deficiency anemia.  Following with PCP.  Has been postulated to be secondary to heavy menses in the past, although not entirely clear that that is the case now.  Could consider evaluation by hematology or GI (if heme positive stools).  Disposition:  Follow up 6 months.  Signed, Jayson JUDITHANN Sierras, M.D., F.A.C.C. [facility] at [facility]

## 2024-07-03 NOTE — Patient Instructions (Signed)
 Medication Instructions:  Your physician recommends that you continue on your current medications as directed. Please refer to the Current Medication list given to you today.   Labwork: None today  Testing/Procedures: None today  Follow-Up: 6 months  Any Other Special Instructions Will Be Listed Below (If Applicable).  If you need a refill on your cardiac medications before your next appointment, please call your pharmacy.
